# Patient Record
Sex: Female | Born: 1955 | Race: White | Hispanic: No | Smoking: Former smoker
Health system: Southern US, Community
[De-identification: ages and names within clinical notes are randomized; demographics above are authoritative.]

## PROBLEM LIST (undated history)

## (undated) VITALS — BP 112/78 | HR 77 | Temp 97.6°F | Resp 18

## (undated) DIAGNOSIS — F410 Panic disorder [episodic paroxysmal anxiety] without agoraphobia: Secondary | ICD-10-CM

## (undated) DIAGNOSIS — E7800 Pure hypercholesterolemia, unspecified: Secondary | ICD-10-CM

## (undated) DIAGNOSIS — I10 Essential (primary) hypertension: Secondary | ICD-10-CM

## (undated) DIAGNOSIS — R51 Headache: Secondary | ICD-10-CM

## (undated) DIAGNOSIS — F319 Bipolar disorder, unspecified: Secondary | ICD-10-CM

## (undated) DIAGNOSIS — F419 Anxiety disorder, unspecified: Secondary | ICD-10-CM

## (undated) HISTORY — PX: APPENDECTOMY: SHX54

## (undated) HISTORY — PX: SHOULDER SURGERY: SHX246

---

## 2000-09-27 ENCOUNTER — Encounter (HOSPITAL_COMMUNITY): Admission: RE | Admit: 2000-09-27 | Discharge: 2000-10-27 | Payer: Self-pay | Admitting: Preventative Medicine

## 2004-02-26 ENCOUNTER — Ambulatory Visit (HOSPITAL_COMMUNITY): Admission: RE | Admit: 2004-02-26 | Discharge: 2004-02-26 | Payer: Self-pay | Admitting: Family Medicine

## 2005-01-28 ENCOUNTER — Ambulatory Visit (HOSPITAL_COMMUNITY): Admission: RE | Admit: 2005-01-28 | Discharge: 2005-01-29 | Payer: Self-pay | Admitting: Internal Medicine

## 2005-12-24 ENCOUNTER — Emergency Department (HOSPITAL_COMMUNITY): Admission: EM | Admit: 2005-12-24 | Discharge: 2005-12-24 | Payer: Self-pay | Admitting: Emergency Medicine

## 2006-02-22 ENCOUNTER — Encounter (HOSPITAL_COMMUNITY): Admission: RE | Admit: 2006-02-22 | Discharge: 2006-03-24 | Payer: Self-pay | Admitting: Orthopaedic Surgery

## 2006-03-22 ENCOUNTER — Ambulatory Visit (HOSPITAL_COMMUNITY): Admission: RE | Admit: 2006-03-22 | Discharge: 2006-03-22 | Payer: Self-pay | Admitting: Family Medicine

## 2006-03-29 ENCOUNTER — Encounter (HOSPITAL_COMMUNITY): Admission: RE | Admit: 2006-03-29 | Discharge: 2006-04-28 | Payer: Self-pay | Admitting: Orthopaedic Surgery

## 2006-08-18 ENCOUNTER — Ambulatory Visit (HOSPITAL_COMMUNITY): Admission: RE | Admit: 2006-08-18 | Discharge: 2006-08-18 | Payer: Self-pay | Admitting: Family Medicine

## 2006-09-28 ENCOUNTER — Emergency Department (HOSPITAL_COMMUNITY): Admission: EM | Admit: 2006-09-28 | Discharge: 2006-09-28 | Payer: Self-pay | Admitting: Emergency Medicine

## 2006-09-30 ENCOUNTER — Emergency Department (HOSPITAL_COMMUNITY): Admission: EM | Admit: 2006-09-30 | Discharge: 2006-09-30 | Payer: Self-pay | Admitting: Emergency Medicine

## 2007-01-18 ENCOUNTER — Ambulatory Visit (HOSPITAL_COMMUNITY): Admission: RE | Admit: 2007-01-18 | Discharge: 2007-01-18 | Payer: Self-pay | Admitting: Family Medicine

## 2007-02-08 ENCOUNTER — Emergency Department (HOSPITAL_COMMUNITY): Admission: EM | Admit: 2007-02-08 | Discharge: 2007-02-08 | Payer: Self-pay | Admitting: Emergency Medicine

## 2007-04-17 ENCOUNTER — Emergency Department (HOSPITAL_COMMUNITY): Admission: EM | Admit: 2007-04-17 | Discharge: 2007-04-17 | Payer: Self-pay | Admitting: Emergency Medicine

## 2007-04-19 ENCOUNTER — Emergency Department (HOSPITAL_COMMUNITY): Admission: EM | Admit: 2007-04-19 | Discharge: 2007-04-19 | Payer: Self-pay | Admitting: Emergency Medicine

## 2007-04-25 ENCOUNTER — Emergency Department (HOSPITAL_COMMUNITY): Admission: EM | Admit: 2007-04-25 | Discharge: 2007-04-25 | Payer: Self-pay | Admitting: Emergency Medicine

## 2007-07-09 ENCOUNTER — Emergency Department (HOSPITAL_COMMUNITY): Admission: EM | Admit: 2007-07-09 | Discharge: 2007-07-09 | Payer: Self-pay | Admitting: Emergency Medicine

## 2007-07-15 ENCOUNTER — Emergency Department (HOSPITAL_COMMUNITY): Admission: EM | Admit: 2007-07-15 | Discharge: 2007-07-15 | Payer: Self-pay | Admitting: Emergency Medicine

## 2007-08-10 ENCOUNTER — Emergency Department (HOSPITAL_COMMUNITY): Admission: EM | Admit: 2007-08-10 | Discharge: 2007-08-10 | Payer: Self-pay | Admitting: Emergency Medicine

## 2007-08-12 ENCOUNTER — Emergency Department (HOSPITAL_COMMUNITY): Admission: EM | Admit: 2007-08-12 | Discharge: 2007-08-12 | Payer: Self-pay | Admitting: Emergency Medicine

## 2007-11-19 ENCOUNTER — Encounter: Payer: Self-pay | Admitting: Orthopedic Surgery

## 2007-11-19 ENCOUNTER — Emergency Department (HOSPITAL_COMMUNITY): Admission: EM | Admit: 2007-11-19 | Discharge: 2007-11-19 | Payer: Self-pay | Admitting: Emergency Medicine

## 2008-01-11 ENCOUNTER — Emergency Department (HOSPITAL_COMMUNITY): Admission: EM | Admit: 2008-01-11 | Discharge: 2008-01-11 | Payer: Self-pay | Admitting: Emergency Medicine

## 2008-01-16 ENCOUNTER — Ambulatory Visit (HOSPITAL_COMMUNITY): Admission: RE | Admit: 2008-01-16 | Discharge: 2008-01-16 | Payer: Self-pay | Admitting: Family Medicine

## 2008-02-12 ENCOUNTER — Ambulatory Visit: Payer: Self-pay | Admitting: Orthopedic Surgery

## 2008-02-12 DIAGNOSIS — S53106A Unspecified dislocation of unspecified ulnohumeral joint, initial encounter: Secondary | ICD-10-CM | POA: Insufficient documentation

## 2008-02-12 DIAGNOSIS — S42209A Unspecified fracture of upper end of unspecified humerus, initial encounter for closed fracture: Secondary | ICD-10-CM | POA: Insufficient documentation

## 2008-02-27 ENCOUNTER — Telehealth: Payer: Self-pay | Admitting: Orthopedic Surgery

## 2008-03-16 ENCOUNTER — Emergency Department (HOSPITAL_COMMUNITY): Admission: EM | Admit: 2008-03-16 | Discharge: 2008-03-16 | Payer: Self-pay | Admitting: Emergency Medicine

## 2008-03-19 ENCOUNTER — Telehealth: Payer: Self-pay | Admitting: Orthopedic Surgery

## 2008-05-02 ENCOUNTER — Encounter: Payer: Self-pay | Admitting: Orthopedic Surgery

## 2008-07-03 ENCOUNTER — Emergency Department (HOSPITAL_COMMUNITY): Admission: EM | Admit: 2008-07-03 | Discharge: 2008-07-03 | Payer: Self-pay | Admitting: Emergency Medicine

## 2008-08-07 ENCOUNTER — Encounter: Payer: Self-pay | Admitting: Orthopedic Surgery

## 2008-08-10 ENCOUNTER — Encounter: Payer: Self-pay | Admitting: Orthopedic Surgery

## 2008-11-01 ENCOUNTER — Emergency Department (HOSPITAL_COMMUNITY): Admission: EM | Admit: 2008-11-01 | Discharge: 2008-11-01 | Payer: Self-pay | Admitting: Emergency Medicine

## 2009-05-20 ENCOUNTER — Emergency Department (HOSPITAL_COMMUNITY): Admission: EM | Admit: 2009-05-20 | Discharge: 2009-05-20 | Payer: Self-pay | Admitting: Emergency Medicine

## 2009-09-12 ENCOUNTER — Emergency Department (HOSPITAL_COMMUNITY): Admission: EM | Admit: 2009-09-12 | Discharge: 2009-09-12 | Payer: Self-pay | Admitting: Emergency Medicine

## 2010-01-06 ENCOUNTER — Ambulatory Visit (HOSPITAL_COMMUNITY): Admission: RE | Admit: 2010-01-06 | Discharge: 2010-01-06 | Payer: Self-pay | Admitting: Family Medicine

## 2010-01-13 ENCOUNTER — Emergency Department (HOSPITAL_COMMUNITY): Admission: EM | Admit: 2010-01-13 | Discharge: 2010-01-13 | Payer: Self-pay | Admitting: Emergency Medicine

## 2010-02-18 ENCOUNTER — Ambulatory Visit (HOSPITAL_COMMUNITY)
Admission: RE | Admit: 2010-02-18 | Discharge: 2010-02-18 | Payer: Self-pay | Source: Home / Self Care | Admitting: Family Medicine

## 2010-05-23 ENCOUNTER — Emergency Department (HOSPITAL_COMMUNITY)
Admission: EM | Admit: 2010-05-23 | Discharge: 2010-05-23 | Payer: Self-pay | Source: Home / Self Care | Admitting: Emergency Medicine

## 2010-05-24 ENCOUNTER — Emergency Department (HOSPITAL_COMMUNITY)
Admission: EM | Admit: 2010-05-24 | Discharge: 2010-05-24 | Payer: Self-pay | Source: Home / Self Care | Admitting: Emergency Medicine

## 2010-06-14 ENCOUNTER — Encounter: Payer: Self-pay | Admitting: Family Medicine

## 2010-08-24 LAB — DIFFERENTIAL
Basophils Absolute: 0.1 10*3/uL (ref 0.0–0.1)
Basophils Relative: 1 % (ref 0–1)
Eosinophils Absolute: 0.1 10*3/uL (ref 0.0–0.7)
Eosinophils Relative: 1 % (ref 0–5)
Lymphocytes Relative: 27 % (ref 12–46)
Lymphs Abs: 2.1 10*3/uL (ref 0.7–4.0)
Monocytes Absolute: 0.4 10*3/uL (ref 0.1–1.0)
Monocytes Relative: 4 % (ref 3–12)
Neutro Abs: 5.3 10*3/uL (ref 1.7–7.7)
Neutrophils Relative %: 66 % (ref 43–77)

## 2010-08-24 LAB — CBC
HCT: 37.4 % (ref 36.0–46.0)
Hemoglobin: 12.7 g/dL (ref 12.0–15.0)
MCHC: 34 g/dL (ref 30.0–36.0)
MCV: 96.1 fL (ref 78.0–100.0)
Platelets: 185 10*3/uL (ref 150–400)
RBC: 3.89 MIL/uL (ref 3.87–5.11)
RDW: 14.5 % (ref 11.5–15.5)
WBC: 8 10*3/uL (ref 4.0–10.5)

## 2011-03-02 LAB — CULTURE, ROUTINE-ABSCESS

## 2011-03-12 ENCOUNTER — Encounter: Payer: Self-pay | Admitting: Emergency Medicine

## 2011-03-12 ENCOUNTER — Emergency Department (HOSPITAL_COMMUNITY)
Admission: EM | Admit: 2011-03-12 | Discharge: 2011-03-12 | Disposition: A | Discharge status: Home / Self Care | Payer: PRIVATE HEALTH INSURANCE | Attending: Emergency Medicine | Admitting: Emergency Medicine

## 2011-03-12 DIAGNOSIS — J45909 Unspecified asthma, uncomplicated: Secondary | ICD-10-CM | POA: Insufficient documentation

## 2011-03-12 DIAGNOSIS — M65839 Other synovitis and tenosynovitis, unspecified forearm: Secondary | ICD-10-CM | POA: Insufficient documentation

## 2011-03-12 DIAGNOSIS — M25519 Pain in unspecified shoulder: Secondary | ICD-10-CM | POA: Insufficient documentation

## 2011-03-12 DIAGNOSIS — M778 Other enthesopathies, not elsewhere classified: Secondary | ICD-10-CM

## 2011-03-12 DIAGNOSIS — M65849 Other synovitis and tenosynovitis, unspecified hand: Secondary | ICD-10-CM | POA: Insufficient documentation

## 2011-03-12 HISTORY — DX: Pure hypercholesterolemia, unspecified: E78.00

## 2011-03-12 MED ORDER — OXYCODONE-ACETAMINOPHEN 5-325 MG PO TABS: 1.0000 | ORAL_TABLET | ORAL | Status: AC | PRN

## 2011-03-12 NOTE — ED Provider Notes (Signed)
History     CSN: [id number] Arrival date & time: 03/12/2011  4:01 PM   First MD Initiated Contact with Patient 03/12/11 1605      Chief Complaint  Patient presents with  . Shoulder Pain    (Consider location/radiation/quality/duration/timing/severity/associated sxs/prior treatment) HPI Comments: Patient c/o worsening , chronic left shoulder pain.  States she had humerus fracture several months ago that required surgery to the shoulder joint and she has chronic pain since that time.  States that her PMD prescribes her a "pain patch" but her insurance company has recently denied payment for and she doesn't have anything currently to take for pain.  She denies new or changing shoulder symptoms.  She also c/o pain to right wrist for several days.  She denies numbness, weakness or injury.  Wrist pain is worse with movement.  States her doctor prescribed an NSAID for her pain, but yesterday she noticed redness and itching to her abdomen.  She denies swelling, difficulty swallowing or breathing. or hives  Patient is a 55 y.o. female presenting with shoulder pain and wrist pain. The history is provided by the patient.  Shoulder Pain This is a chronic problem. The current episode started in the past 7 days. The problem occurs constantly. The problem has been gradually worsening. Associated symptoms include arthralgias. Pertinent negatives include no abdominal pain, anorexia, change in bowel habit, chest pain, chills, congestion, coughing, fatigue, fever, joint swelling, myalgias, nausea, neck pain, numbness, rash, sore throat, urinary symptoms, vomiting or weakness. The symptoms are aggravated by twisting (movement). She has tried NSAIDs for the symptoms. The treatment provided no relief.  Wrist Pain This is a new problem. The current episode started in the past 7 days. The problem occurs constantly. The problem has been unchanged. Associated symptoms include arthralgias. Pertinent negatives include no  abdominal pain, anorexia, change in bowel habit, chest pain, chills, congestion, coughing, fatigue, fever, joint swelling, myalgias, nausea, neck pain, numbness, rash, sore throat, urinary symptoms, vomiting or weakness. Exacerbated by: movement and palpation. She has tried NSAIDs for the symptoms. The treatment provided no relief.    Past Medical History  Diagnosis Date  . High cholesterol   . Asthma     Past Surgical History  Procedure Date  . Shoulder surgery     History reviewed. No pertinent family history.  History  Substance Use Topics  . Smoking status: Never Smoker   . Smokeless tobacco: Not on file  . Alcohol Use: No    OB History    Grav Para Term Preterm Abortions TAB SAB Ect Mult Living                  Review of Systems  Constitutional: Negative for fever, chills and fatigue.  HENT: Negative for congestion, sore throat, trouble swallowing, neck pain and neck stiffness.   Respiratory: Negative for cough, shortness of breath and wheezing.   Cardiovascular: Negative for chest pain and palpitations.  Gastrointestinal: Negative for nausea, vomiting, abdominal pain, blood in stool, anorexia and change in bowel habit.  Genitourinary: Negative for dysuria, hematuria and flank pain.  Musculoskeletal: Positive for arthralgias. Negative for myalgias, back pain and joint swelling.  Skin: Negative for rash.  Neurological: Negative for dizziness, weakness and numbness.  Hematological: Does not bruise/bleed easily.    Allergies  Sulfonamide derivatives  Home Medications  No current outpatient prescriptions on file.  BP 114/78  Pulse 60  Temp(Src) 98 F (36.7 C) (Oral)  Resp 20  Ht 5\' 4"  (1.626  m)  Wt 113 lb (51.256 kg)  BMI 19.40 kg/m2  SpO2 97%  Physical Exam  Nursing note and vitals reviewed. Constitutional: She is oriented to person, place, and time. She appears well-developed and well-nourished. No distress.  HENT:  Head: Normocephalic and atraumatic.    Mouth/Throat: Oropharynx is clear and moist.  Neck: Normal range of motion. Neck supple.  Cardiovascular: Normal rate, regular rhythm and normal heart sounds.   Pulmonary/Chest: Effort normal and breath sounds normal. No respiratory distress. She exhibits no tenderness.  Abdominal: Soft. She exhibits no distension. There is no tenderness.  Musculoskeletal: She exhibits tenderness.       Left shoulder: She exhibits decreased range of motion, tenderness, bony tenderness and pain. She exhibits no swelling, no effusion, no crepitus, no laceration, normal pulse and normal strength.       Right wrist: She exhibits tenderness and swelling. She exhibits normal range of motion, no bony tenderness, no effusion, no crepitus, no deformity and no laceration.       Well healed surgical scar to the anterior left shoulder joint.  Radial pulse is strong and equal bilaterally.    Lymphadenopathy:    She has no cervical adenopathy.  Neurological: She is alert and oriented to person, place, and time. She has normal reflexes. No cranial nerve deficit. She exhibits normal muscle tone. Coordination normal.  Skin: Skin is warm and dry.    ED Course  ORTHOPEDIC INJURY TREATMENT Date/Time: 03/12/2011 4:40 PM Performed by: Trisha Mangle, Abbigaile Rockman L. Authorized by: Maxwell Caul Consent: Verbal consent obtained. Written consent not obtained. Risks and benefits: risks, benefits and alternatives were discussed Consent given by: patient Patient understanding: patient states understanding of the procedure being performed Patient consent: the patient's understanding of the procedure matches consent given Procedure consent: procedure consent matches procedure scheduled Patient identity confirmed: verbally with patient Time out: Immediately prior to procedure a "time out" was called to verify the correct patient, procedure, equipment, support staff and site/side marked as required. Injury location: wrist Pre-procedure  neurovascular assessment: neurovascularly intact Pre-procedure distal perfusion: normal Pre-procedure neurological function: normal Pre-procedure range of motion: normal Local anesthesia used: no Patient sedated: no Immobilization: brace Splint type: velcro wrist brace. Post-procedure neurovascular assessment: post-procedure neurovascularly intact Post-procedure distal perfusion: normal Post-procedure neurological function: normal Post-procedure range of motion: normal Patient tolerance: Patient tolerated the procedure well with no immediate complications.   (including critical care time)       MDM   I have reviewed the patient's previous medical record.  I have recommended that she d/c the NSAID.  No hives or edema or airway compromise.  I will prescribe a short course of pain medication and pt agrees to f/u with Dr. Janna Arch.    Patient / Family / Caregiver understand and agree with initial ED impression and plan with expectations set for ED visit.        Myya Meenach L. Kimisha Eunice, PA 03/12/11 1642  Tyjai Matuszak L. [place], [place] 03/12/11 1651

## 2011-03-12 NOTE — ED Provider Notes (Signed)
Medical screening examination/treatment/procedure(s) were performed by non-physician practitioner and as supervising physician I was immediately available for consultation/collaboration.  Donnetta Hutching, MD 03/12/11 [phone]

## 2011-03-12 NOTE — ED Notes (Signed)
Pt here for left shoulder pain-h/s surgery 08/07/08. Pt also c/o pain to right wrist.

## 2011-03-14 ENCOUNTER — Emergency Department (HOSPITAL_COMMUNITY)
Admission: EM | Admit: 2011-03-14 | Discharge: 2011-03-14 | Disposition: A | Discharge status: Home / Self Care | Payer: PRIVATE HEALTH INSURANCE | Attending: Emergency Medicine | Admitting: Emergency Medicine

## 2011-03-14 ENCOUNTER — Encounter (HOSPITAL_COMMUNITY): Payer: Self-pay

## 2011-03-14 ENCOUNTER — Emergency Department (HOSPITAL_COMMUNITY): Payer: PRIVATE HEALTH INSURANCE

## 2011-03-14 DIAGNOSIS — K5289 Other specified noninfective gastroenteritis and colitis: Secondary | ICD-10-CM | POA: Insufficient documentation

## 2011-03-14 DIAGNOSIS — E7800 Pure hypercholesterolemia, unspecified: Secondary | ICD-10-CM | POA: Insufficient documentation

## 2011-03-14 DIAGNOSIS — J45909 Unspecified asthma, uncomplicated: Secondary | ICD-10-CM | POA: Insufficient documentation

## 2011-03-14 DIAGNOSIS — R0602 Shortness of breath: Secondary | ICD-10-CM | POA: Insufficient documentation

## 2011-03-14 DIAGNOSIS — K529 Noninfective gastroenteritis and colitis, unspecified: Secondary | ICD-10-CM

## 2011-03-14 DIAGNOSIS — I10 Essential (primary) hypertension: Secondary | ICD-10-CM | POA: Insufficient documentation

## 2011-03-14 DIAGNOSIS — F319 Bipolar disorder, unspecified: Secondary | ICD-10-CM | POA: Insufficient documentation

## 2011-03-14 HISTORY — DX: Bipolar disorder, unspecified: F31.9

## 2011-03-14 HISTORY — DX: Essential (primary) hypertension: I10

## 2011-03-14 LAB — DIFFERENTIAL
Basophils Absolute: 0 10*3/uL (ref 0.0–0.1)
Basophils Relative: 0 % (ref 0–1)
Lymphocytes Relative: 10 % — ABNORMAL LOW (ref 12–46)
Monocytes Absolute: 0.2 10*3/uL (ref 0.1–1.0)
Monocytes Relative: 3 % (ref 3–12)
Neutro Abs: 6.3 10*3/uL (ref 1.7–7.7)
Neutrophils Relative %: 86 % — ABNORMAL HIGH (ref 43–77)

## 2011-03-14 LAB — COMPREHENSIVE METABOLIC PANEL
AST: 17 U/L (ref 0–37)
Albumin: 3.5 g/dL (ref 3.5–5.2)
Alkaline Phosphatase: 40 U/L (ref 39–117)
BUN: 13 mg/dL (ref 6–23)
CO2: 25 mEq/L (ref 19–32)
Chloride: 105 mEq/L (ref 96–112)
Creatinine, Ser: 0.65 mg/dL (ref 0.50–1.10)
GFR calc non Af Amer: >90 mL/min (ref >90)
Potassium: 4.4 mEq/L (ref 3.5–5.1)
Total Bilirubin: 0.2 mg/dL — ABNORMAL LOW (ref 0.3–1.2)

## 2011-03-14 LAB — CBC
HCT: 36.9 % (ref 36.0–46.0)
Hemoglobin: 12.5 g/dL (ref 12.0–15.0)
RBC: 3.9 MIL/uL (ref 3.87–5.11)
WBC: 7.3 10*3/uL (ref 4.0–10.5)

## 2011-03-14 MED ORDER — HYDROCODONE-ACETAMINOPHEN 5-325 MG PO TABS: 1.0000 | ORAL_TABLET | ORAL | Status: AC | PRN

## 2011-03-14 MED ORDER — SODIUM CHLORIDE 0.9 % IV BOLUS (SEPSIS)
1000.0000 mL | Freq: Once | INTRAVENOUS | Status: AC
  Administered 2011-03-14 (×2): 1000 mL via INTRAVENOUS

## 2011-03-14 MED ORDER — PROMETHAZINE HCL 25 MG/ML IJ SOLN
INTRAMUSCULAR | Status: AC
  Administered 2011-03-14: 12.5 mg via INTRAVENOUS
  Filled 2011-03-14: qty 1

## 2011-03-14 MED ORDER — SODIUM CHLORIDE 0.9 % IV BOLUS (SEPSIS): 1000.0000 mL | Freq: Once | INTRAVENOUS | Status: DC

## 2011-03-14 MED ORDER — DIPHENOXYLATE-ATROPINE 2.5-0.025 MG PO TABS: 1.0000 | ORAL_TABLET | Freq: Four times a day (QID) | ORAL | Status: AC | PRN

## 2011-03-14 MED ORDER — HYDROMORPHONE HCL 1 MG/ML IJ SOLN
1.0000 mg | Freq: Once | INTRAMUSCULAR | Status: AC
  Administered 2011-03-14: 1 mg via INTRAVENOUS
  Filled 2011-03-14: qty 1

## 2011-03-14 MED ORDER — ONDANSETRON HCL 4 MG PO TABS: 4.0000 mg | ORAL_TABLET | Freq: Four times a day (QID) | ORAL | Status: AC

## 2011-03-14 MED ORDER — PANTOPRAZOLE SODIUM 40 MG IV SOLR
40.0000 mg | Freq: Once | INTRAVENOUS | Status: AC
  Administered 2011-03-14: 40 mg via INTRAVENOUS
  Filled 2011-03-14: qty 40

## 2011-03-14 MED ORDER — ONDANSETRON HCL 4 MG/2ML IJ SOLN
INTRAMUSCULAR | Status: AC
  Administered 2011-03-14: 4 mg via INTRAVENOUS
  Filled 2011-03-14: qty 2

## 2011-03-14 NOTE — ED Provider Notes (Signed)
Scribed for Donnetta Hutching, MD, the patient was seen in room APA02/APA02 . This chart was scribed by Ellie Lunch.   CSN: [id number] Arrival date & time: 03/14/2011  3:25 PM   First MD Initiated Contact with Patient 03/14/11 1532      Chief Complaint  Patient presents with  . Nausea  . Emesis  . Diarrhea    (Consider location/radiation/quality/duration/timing/severity/associated sxs/prior treatment) HPI Melinda Wood is a 55 y.o. female with a history of asthma brought in by ambulance to the Emergency Department complaining of emesis since yesterday morning at 10 with associated nausea, diarrhea (1 episode) and diffuse abdominal pain. Pt describes symptoms as constant and severe. Pt reports sx are improved by nothing. Pt also reports SOB. Pt reports she has an inhaler but was not able to find it. SOB aggravated by supine position and exertion. Denies wheezing. There are no other associated symptoms and no other alleviating or aggravating factors.   Past Medical History  Diagnosis Date  . High cholesterol   . Asthma   . Hypertension   . Bipolar 1 disorder   . Urinary incontinence     Past Surgical History  Procedure Date  . Shoulder surgery   . Appendectomy     No family history on file.  History  Substance Use Topics  . Smoking status: Never Smoker   . Smokeless tobacco: Not on file  . Alcohol Use: No   Review of Systems 10 Systems reviewed and are negative for acute change except as noted in the HPI.  Allergies  Sulfonamide derivatives  Home Medications   Current Outpatient Rx  Name Route Sig Dispense Refill  . ALPRAZOLAM 1 MG PO TABS Oral Take 0.5 mg by mouth 2 (two) times daily as needed. anxiety     . ATOMOXETINE HCL 60 MG PO CAPS Oral Take 60 mg by mouth daily.      Marland Kitchen DIVALPROEX SODIUM 250 MG PO TB24 Oral Take 250 mg by mouth at bedtime.      . FESOTERODINE FUMARATE 4 MG PO TB24 Oral Take 4 mg by mouth daily.      Marland Kitchen HYDROXYZINE PAMOATE 50 MG PO CAPS Oral  Take 50 mg by mouth at bedtime.      . OXYCODONE-ACETAMINOPHEN 5-325 MG PO TABS Oral Take 1 tablet by mouth every 4 (four) hours as needed for pain. 20 tablet 0  . PREGABALIN 50 MG PO CAPS Oral Take 50 mg by mouth at bedtime.      Marland Kitchen PRIMIDONE 250 MG PO TABS Oral Take 125-250 mg by mouth 3 (three) times daily. Patient takes 125mg  in the morning and 125mg  midday and 250mg  at bedtime     . PRIMIDONE 50 MG PO TABS Oral Take 100 mg by mouth 3 (three) times daily.      Marland Kitchen RISEDRONATE SODIUM 35 MG PO TBEC Oral Take 1 tablet by mouth once a week.      Marland Kitchen ROSUVASTATIN CALCIUM 10 MG PO TABS Oral Take 10 mg by mouth daily.        BP 144/70  Pulse 61  Temp(Src) 98.7 F (37.1 C) (Oral)  Resp 21  Ht 5\' 4"  (1.626 m)  Wt 113 lb (51.256 kg)  BMI 19.40 kg/m2  SpO2 100%  Physical Exam  Nursing note and vitals reviewed. Constitutional: She is oriented to person, place, and time. She appears well-developed and well-nourished.  HENT:  Head: Normocephalic and atraumatic.  Eyes: Conjunctivae and EOM are normal.  Neck: Normal  range of motion. Neck supple.  Cardiovascular: Normal rate, regular rhythm and normal heart sounds.   Pulmonary/Chest: Effort normal and breath sounds normal.  Abdominal: Soft. She exhibits no distension. There is tenderness (minimal diffuse tenderness).  Musculoskeletal: Normal range of motion.  Neurological: She is alert and oriented to person, place, and time.  Skin: Skin is warm and dry.  Psychiatric: She has a normal mood and affect. Her behavior is normal.   ED Course  Procedures (including critical care time)  OTHER DATA REVIEWED: Nursing notes, vital signs, and past medical records reviewed.  DIAGNOSTIC STUDIES: Oxygen Saturation is 100% on room air, normal by my interpretation.   ED LABS Labs Reviewed  DIFFERENTIAL - Abnormal; Notable for the following:    Neutrophils Relative 86 (*)    Lymphocytes Relative 10 (*)    All other components within normal limits    COMPREHENSIVE METABOLIC PANEL - Abnormal; Notable for the following:    Glucose, Bld 115 (*)    Calcium 8.2 (*)    Total Protein 5.8 (*)    Total Bilirubin 0.2 (*)    All other components within normal limits  CBC  LIPASE, BLOOD   Dg Chest 2 View  03/14/2011  *RADIOLOGY REPORT*  Clinical Data: Shortness of breath.  History of asthma.  CHEST - 2 VIEW  Comparison: 01/11/2008  Findings: Lateral view degraded by patient arm position.  Proximal left humeral fixation. Midline trachea.  Normal heart size and mediastinal contours. No pleural effusion or pneumothorax. Clear lungs.  IMPRESSION: No acute cardiopulmonary disease.  Original Report Authenticated By: Consuello Bossier, M.D.    3:57 PM discussed with PT appears dehydrated. Plan to check blood, xray chest. Plan to treat with fluids, zofran and treat pain.  ED MEDS  Medications  sodium chloride 0.9 % bolus 1,000 mL   pantoprazole (PROTONIX) injection 40 mg   ondansetron (ZOFRAN) 4 MG/2ML injection (4 mg Intravenous Given 03/14/11 1535)  sodium chloride 0.9 % bolus 1,000 mL (1000 mL Intravenous Given 03/14/11 1542)  HYDROmorphone (DILAUDID) injection 1 mg (1 mg Intravenous Given 03/14/11 1541)   No diagnosis found.   MDM  He should feeling better after IV hydration. Labs acceptable. Can discharge home.  I personally performed the services described in this documentation, which was scribed in my presence. The recorded information has been reviewed and considered.         Donnetta Hutching, MD 03/16/11 0005

## 2011-03-14 NOTE — ED Notes (Signed)
Pt c/o n/v/d since 10am yesterday morning.

## 2011-03-14 NOTE — ED Notes (Signed)
Has had an episode of vomiting and is reporting nausea--has had approx. 1500 mls of normal saline IV fluid and reports she does not feel she needs to urinate.  IV fluid turned down to slower rate.  Lungs remain CTA

## 2011-03-14 NOTE — ED Notes (Signed)
Pt left er stating no needs 

## 2011-03-14 NOTE — ED Notes (Signed)
DUPLICATE ENTRY--Receiving initial 1 liter bolus now

## 2011-03-14 NOTE — ED Notes (Signed)
To x-ray via stretcher--skin color more pink and does not appear as weak as at arrival time.

## 2011-03-14 NOTE — ED Notes (Signed)
Reports IV med has eased abd. Pain some but still c/o nausea.

## 2011-03-14 NOTE — ED Notes (Signed)
C/o SEVERE low mid abdominal pain, nausea with vomiting and diarrhea numerous times over the past two days---hyperactive bowel sounds all quads

## 2011-03-14 NOTE — ED Notes (Signed)
Again, requested she try to void but, states "I dont have to right now."

## 2011-08-05 ENCOUNTER — Encounter (HOSPITAL_COMMUNITY): Payer: Self-pay | Admitting: *Deleted

## 2011-08-05 ENCOUNTER — Emergency Department (HOSPITAL_COMMUNITY)
Admission: EM | Admit: 2011-08-05 | Discharge: 2011-08-05 | Disposition: A | Discharge status: Home / Self Care | Payer: Medicare PPO | Attending: Emergency Medicine | Admitting: Emergency Medicine

## 2011-08-05 ENCOUNTER — Emergency Department (HOSPITAL_COMMUNITY): Payer: Medicare PPO

## 2011-08-05 DIAGNOSIS — I10 Essential (primary) hypertension: Secondary | ICD-10-CM | POA: Insufficient documentation

## 2011-08-05 DIAGNOSIS — Z79899 Other long term (current) drug therapy: Secondary | ICD-10-CM | POA: Insufficient documentation

## 2011-08-05 DIAGNOSIS — R209 Unspecified disturbances of skin sensation: Secondary | ICD-10-CM | POA: Insufficient documentation

## 2011-08-05 DIAGNOSIS — Y92009 Unspecified place in unspecified non-institutional (private) residence as the place of occurrence of the external cause: Secondary | ICD-10-CM | POA: Insufficient documentation

## 2011-08-05 DIAGNOSIS — W1809XA Striking against other object with subsequent fall, initial encounter: Secondary | ICD-10-CM | POA: Insufficient documentation

## 2011-08-05 DIAGNOSIS — E7800 Pure hypercholesterolemia, unspecified: Secondary | ICD-10-CM | POA: Insufficient documentation

## 2011-08-05 DIAGNOSIS — R079 Chest pain, unspecified: Secondary | ICD-10-CM | POA: Insufficient documentation

## 2011-08-05 DIAGNOSIS — J45909 Unspecified asthma, uncomplicated: Secondary | ICD-10-CM | POA: Insufficient documentation

## 2011-08-05 DIAGNOSIS — S20219A Contusion of unspecified front wall of thorax, initial encounter: Secondary | ICD-10-CM | POA: Insufficient documentation

## 2011-08-05 DIAGNOSIS — F319 Bipolar disorder, unspecified: Secondary | ICD-10-CM | POA: Insufficient documentation

## 2011-08-05 MED ORDER — HYDROCODONE-ACETAMINOPHEN 5-325 MG PO TABS
1.0000 | ORAL_TABLET | Freq: Once | ORAL | Status: AC
  Administered 2011-08-05: 1 via ORAL
  Filled 2011-08-05: qty 1

## 2011-08-05 MED ORDER — HYDROCODONE-ACETAMINOPHEN 5-325 MG PO TABS: 1.0000 | ORAL_TABLET | ORAL | Status: AC | PRN

## 2011-08-05 MED ORDER — KETOROLAC TROMETHAMINE 60 MG/2ML IM SOLN
60.0000 mg | Freq: Once | INTRAMUSCULAR | Status: AC
  Administered 2011-08-05: 60 mg via INTRAMUSCULAR
  Filled 2011-08-05: qty 2

## 2011-08-05 NOTE — ED Notes (Addendum)
Pt c/o increased left rib pain with each breath secondary to a fall she sustained yesterday on a "scatter rug" while getting out of bed. Pt states pain "is localized in the ribs".  Pt denies other injuries at this time.  BBS clear and equal.

## 2011-08-05 NOTE — Discharge Instructions (Signed)
Chest Contusion You have been checked for injuries to your chest. Your caregiver has not found injuries serious enough to require hospitalization. It is common to have bruises and sore muscles after an injury. These tend to feel worse the first 24 hours. You may gradually develop more stiffness and soreness over the next several hours to several days. This usually feels worse the first morning following your injury. After a few days, you will usually begin to improve. The amount of improvement depends on the amount of damage. Following the accident, if the pain in any area continues to increase or you develop new areas of pain, you should see your primary caregiver or return to the Emergency Department for re-evaluation. HOME CARE INSTRUCTIONS   Put ice on sore areas every 2 hours for 20 minutes while awake for the next 2 days.   Drink extra fluids. Do not drink alcohol.   Activity as tolerated. Lifting may make pain worse.   Only take over-the-counter or prescription medicines for pain, discomfort, or fever as directed by your caregiver. Do not use aspirin. This may increase bruising or increase bleeding.  SEEK IMMEDIATE MEDICAL CARE IF:   There is a worsening of any of the problems that brought you in for care.   Shortness of breath, dizziness or fainting develop.   You have chest pain, difficulty breathing, or develop pain going down the left arm or up into jaw.   You feel sick to your stomach (nausea), vomiting or sweats.   You have increasing belly (abdominal) discomfort.   There is blood in your urine, stool, or if you vomit blood.   There is pain in either shoulder in an area where a shoulder strap would be.   You have feelings of lightheadedness, or if you should have a fainting episode.   You have numbness, tingling, weakness, or problems with the use of your arms or legs.   Severe headaches not relieved with medications develop.   You have a change in bowel or bladder  control.   There is increasing pain in any areas of the body.  If you feel your symptoms are worsening, and you are not able to see your primary caregiver, return to the Emergency Department immediately. MAKE SURE YOU:   Understand these instructions.   Will watch your condition.   Will get help right away if you are not doing well or get worse.  Document Released: 02/02/2001 Document Revised: 04/29/2011 Document Reviewed: 12/27/2007 [facility] Patient Information 2012 [place], [place].    You may take the hydrocodone prescribed for pain relief.  This will make you drowsy - do not drive within 4 hours of taking this medication.  Use a heating pad applied to your chest for 20 minutes several times daily.  Your xrays are negative for rib fracture.

## 2011-08-05 NOTE — ED Notes (Signed)
Slipped on rug when getting OOB. Struck lt ribs against chest.

## 2011-08-06 NOTE — ED Provider Notes (Signed)
History     CSN: [id number]  Arrival date & time 08/05/11  1423   First MD Initiated Contact with Patient 08/05/11 1512      Chief Complaint  Patient presents with  . Fall    (Consider location/radiation/quality/duration/timing/severity/associated sxs/prior treatment) Patient is a 56 y.o. female presenting with fall. The history is provided by the patient.  Fall The accident occurred 3 to 5 hours ago. The fall occurred while standing (She slipped on a rug as she was getting out of bed this morning landing with her left chest against her nightstand.). There was no blood loss. Point of impact: Chest. Pain location: Chest. The pain is at a severity of 10/10. The pain is severe. She was ambulatory at the scene. There was no entrapment after the fall. Pertinent negatives include no fever, no numbness, no abdominal pain, no nausea, no headaches and no loss of consciousness. Associated symptoms comments: Patient did not hit her head.  She denies any other injury.. Exacerbated by: Palpation and deep inspiration makes symptoms worse. She has tried nothing for the symptoms.    Past Medical History  Diagnosis Date  . High cholesterol   . Asthma   . Hypertension   . Bipolar 1 disorder   . Urinary incontinence     Past Surgical History  Procedure Date  . Shoulder surgery   . Appendectomy     History reviewed. No pertinent family history.  History  Substance Use Topics  . Smoking status: Never Smoker   . Smokeless tobacco: Not on file  . Alcohol Use: No    OB History    Grav Para Term Preterm Abortions TAB SAB Ect Mult Living                  Review of Systems  Constitutional: Negative for fever.  HENT: Negative for congestion, sore throat and neck pain.   Eyes: Negative.   Respiratory: Negative for cough, chest tightness and shortness of breath.   Cardiovascular: Positive for chest pain.  Gastrointestinal: Negative for nausea and abdominal pain.  Genitourinary: Negative.     Musculoskeletal: Negative for joint swelling and arthralgias.  Skin: Negative.  Negative for rash and wound.  Neurological: Negative for dizziness, loss of consciousness, weakness, light-headedness, numbness and headaches.  Hematological: Negative.   Psychiatric/Behavioral: Negative.     Allergies  Arthrotec and Sulfonamide derivatives  Home Medications   Current Outpatient Rx  Name Route Sig Dispense Refill  . ALPRAZOLAM 0.5 MG PO TABS Oral Take 0.5 mg by mouth daily as needed. For anxiety    . ATOMOXETINE HCL 60 MG PO CAPS Oral Take 60 mg by mouth daily.      Marland Kitchen CALCIUM CARBONATE-VITAMIN D 500-200 MG-UNIT PO TABS Oral Take 1 tablet by mouth 2 (two) times daily.      Marland Kitchen DIVALPROEX SODIUM ER 250 MG PO TB24 Oral Take 250 mg by mouth at bedtime.      Marland Kitchen DIVALPROEX SODIUM ER 500 MG PO TB24 Oral Take 500 mg by mouth at bedtime.      Marland Kitchen FLUTICASONE-SALMETEROL 250-50 MCG/DOSE IN AEPB Inhalation Inhale 1 puff into the lungs every 12 (twelve) hours.      Marland Kitchen OLMESARTAN MEDOXOMIL 40 MG PO TABS Oral Take 40 mg by mouth daily.      Marland Kitchen PAROXETINE HCL 20 MG PO TABS Oral Take 20 mg by mouth every morning.      Marland Kitchen PREGABALIN 50 MG PO CAPS Oral Take 50 mg by mouth  at bedtime.      Marland Kitchen RISEDRONATE SODIUM 35 MG PO TBEC Oral Take 1 tablet by mouth once a week. Patient takes on Tuesday!!    . ROSUVASTATIN CALCIUM 10 MG PO TABS Oral Take 10 mg by mouth daily.      . TETRAHYDROZOLINE HCL 0.05 % OP SOLN Both Eyes Place 1 drop into both eyes daily.    Marland Kitchen DETROL PO Oral Take 1 tablet by mouth every morning.    . TRAZODONE HCL 100 MG PO TABS Oral Take 200 mg by mouth at bedtime.     Marland Kitchen VITAMIN B-12 1000 MCG PO TABS Oral Take 1,000 mcg by mouth daily.      Marland Kitchen VITAMIN C 500 MG PO TABS Oral Take 500 mg by mouth at bedtime.     Marland Kitchen HYDROCODONE-ACETAMINOPHEN 5-325 MG PO TABS Oral Take 1 tablet by mouth every 4 (four) hours as needed for pain. 15 tablet 0    BP 119/84  Pulse 84  Temp(Src) 97.9 F (36.6 C) (Oral)  Resp 17   Ht 5\' 4"  (1.626 m)  Wt 120 lb (54.432 kg)  BMI 20.60 kg/m2  SpO2 95%  Physical Exam  Nursing note and vitals reviewed. Constitutional: She is oriented to person, place, and time. She appears well-developed and well-nourished.  HENT:  Head: Normocephalic and atraumatic.  Eyes: Conjunctivae are normal.  Neck: Normal range of motion.  Cardiovascular: Normal rate, regular rhythm, normal heart sounds and intact distal pulses.   Pulmonary/Chest: Effort normal and breath sounds normal. No respiratory distress. She has no wheezes. She has no rales. She exhibits tenderness.    Abdominal: Soft. Bowel sounds are normal. There is no tenderness. There is no rigidity and no guarding.  Musculoskeletal: Normal range of motion.  Neurological: She is alert and oriented to person, place, and time.  Skin: Skin is warm and dry.  Psychiatric: She has a normal mood and affect.    ED Course  Procedures (including critical care time)  Labs Reviewed - No data to display Dg Ribs Unilateral W/chest Left  08/05/2011  *RADIOLOGY REPORT*  Clinical Data: Pain after fall  LEFT RIBS AND CHEST - 3+ VIEW  Comparison: March 14, 2011  Findings: The cardiac silhouette, mediastinum, pulmonary vasculature are within normal limits.  Both lungs are clear.  There is no evidence of rib fracture or pneumothorax.  Side plate and screw fixation of old proximal left humeral fracture is stable in appearance.  IMPRESSION: There is no evidence of acute abnormality.  Original Report Authenticated By: Brandon Melnick, M.D.     1. Chest wall contusion       MDM  Hydrocodone, patient cautioned regarding sedating effect.  Heating pad.  Follow up with PCP if not improving over the next week to 10 days.  Also encouraged to get rechecked if she develop shortness of breath fevers chills or increased cough.       Candis Musa, PA 08/06/11 [phone]

## 2011-08-06 NOTE — ED Provider Notes (Signed)
Medical screening examination/treatment/procedure(s) were performed by non-physician practitioner and as supervising physician I was immediately available for consultation/collaboration.   Jelissa Espiritu, MD 08/06/11 1308 

## 2012-01-01 ENCOUNTER — Encounter (HOSPITAL_COMMUNITY): Payer: Self-pay | Admitting: Emergency Medicine

## 2012-01-01 ENCOUNTER — Emergency Department (HOSPITAL_COMMUNITY)
Admission: EM | Admit: 2012-01-01 | Discharge: 2012-01-01 | Disposition: A | Discharge status: Home / Self Care | Payer: PRIVATE HEALTH INSURANCE | Attending: Emergency Medicine | Admitting: Emergency Medicine

## 2012-01-01 ENCOUNTER — Emergency Department (HOSPITAL_COMMUNITY): Payer: PRIVATE HEALTH INSURANCE

## 2012-01-01 DIAGNOSIS — R0602 Shortness of breath: Secondary | ICD-10-CM | POA: Insufficient documentation

## 2012-01-01 DIAGNOSIS — R0989 Other specified symptoms and signs involving the circulatory and respiratory systems: Secondary | ICD-10-CM | POA: Insufficient documentation

## 2012-01-01 DIAGNOSIS — R0609 Other forms of dyspnea: Secondary | ICD-10-CM | POA: Insufficient documentation

## 2012-01-01 DIAGNOSIS — Z79899 Other long term (current) drug therapy: Secondary | ICD-10-CM | POA: Insufficient documentation

## 2012-01-01 DIAGNOSIS — I959 Hypotension, unspecified: Secondary | ICD-10-CM | POA: Insufficient documentation

## 2012-01-01 DIAGNOSIS — E7800 Pure hypercholesterolemia, unspecified: Secondary | ICD-10-CM | POA: Insufficient documentation

## 2012-01-01 DIAGNOSIS — J45909 Unspecified asthma, uncomplicated: Secondary | ICD-10-CM | POA: Insufficient documentation

## 2012-01-01 DIAGNOSIS — R0600 Dyspnea, unspecified: Secondary | ICD-10-CM

## 2012-01-01 DIAGNOSIS — F319 Bipolar disorder, unspecified: Secondary | ICD-10-CM | POA: Insufficient documentation

## 2012-01-01 DIAGNOSIS — F17200 Nicotine dependence, unspecified, uncomplicated: Secondary | ICD-10-CM | POA: Insufficient documentation

## 2012-01-01 DIAGNOSIS — R0789 Other chest pain: Secondary | ICD-10-CM | POA: Insufficient documentation

## 2012-01-01 DIAGNOSIS — I10 Essential (primary) hypertension: Secondary | ICD-10-CM | POA: Insufficient documentation

## 2012-01-01 HISTORY — DX: Anxiety disorder, unspecified: F41.9

## 2012-01-01 HISTORY — DX: Panic disorder (episodic paroxysmal anxiety): F41.0

## 2012-01-01 LAB — TROPONIN I
Troponin I: <0.3 ng/mL (ref <0.30)
Troponin I: <0.3 ng/mL (ref <0.30)

## 2012-01-01 LAB — CBC WITH DIFFERENTIAL/PLATELET
Basophils Absolute: 0.1 10*3/uL (ref 0.0–0.1)
Eosinophils Relative: 1 % (ref 0–5)
HCT: 34.3 % — ABNORMAL LOW (ref 36.0–46.0)
Hemoglobin: 11.8 g/dL — ABNORMAL LOW (ref 12.0–15.0)
Lymphocytes Relative: 31 % (ref 12–46)
Lymphs Abs: 2.6 10*3/uL (ref 0.7–4.0)
MCV: 93.7 fL (ref 78.0–100.0)
Monocytes Absolute: 0.4 10*3/uL (ref 0.1–1.0)
Neutro Abs: 5.2 10*3/uL (ref 1.7–7.7)
RBC: 3.66 MIL/uL — ABNORMAL LOW (ref 3.87–5.11)
WBC: 8.3 10*3/uL (ref 4.0–10.5)

## 2012-01-01 LAB — BASIC METABOLIC PANEL
CO2: 20 mEq/L (ref 19–32)
Calcium: 10 mg/dL (ref 8.4–10.5)
Chloride: 101 mEq/L (ref 96–112)
Creatinine, Ser: 1.06 mg/dL (ref 0.50–1.10)
Glucose, Bld: 113 mg/dL — ABNORMAL HIGH (ref 70–99)

## 2012-01-01 LAB — PROCALCITONIN: Procalcitonin: <0.05 ng/mL

## 2012-01-01 LAB — LACTIC ACID, PLASMA: Lactic Acid, Venous: 1.7 mmol/L (ref 0.5–2.2)

## 2012-01-01 LAB — D-DIMER, QUANTITATIVE: D-Dimer, Quant: <0.22 ug/mL-FEU (ref 0.00–0.48)

## 2012-01-01 MED ORDER — SODIUM CHLORIDE 0.9 % IV SOLN
Freq: Once | INTRAVENOUS | Status: AC
  Administered 2012-01-01: 1000 mL via INTRAVENOUS

## 2012-01-01 MED ORDER — SODIUM CHLORIDE 0.9 % IV BOLUS (SEPSIS)
1000.0000 mL | Freq: Once | INTRAVENOUS | Status: AC
  Administered 2012-01-01: 1000 mL via INTRAVENOUS

## 2012-01-01 MED ORDER — LORAZEPAM 2 MG/ML IJ SOLN: 1.0000 mg | Freq: Once | INTRAMUSCULAR | Status: DC

## 2012-01-01 MED ORDER — ASPIRIN 81 MG PO CHEW
324.0000 mg | CHEWABLE_TABLET | Freq: Once | ORAL | Status: AC
  Administered 2012-01-01: 324 mg via ORAL
  Filled 2012-01-01: qty 4

## 2012-01-01 MED ORDER — MORPHINE SULFATE 4 MG/ML IJ SOLN
4.0000 mg | Freq: Once | INTRAMUSCULAR | Status: AC
  Administered 2012-01-01: 4 mg via INTRAVENOUS
  Filled 2012-01-01: qty 1

## 2012-01-01 MED ORDER — LORAZEPAM 2 MG/ML IJ SOLN
0.5000 mg | Freq: Once | INTRAMUSCULAR | Status: AC
  Administered 2012-01-01: 0.5 mg via INTRAVENOUS
  Filled 2012-01-01: qty 1

## 2012-01-01 NOTE — ED Notes (Signed)
Patient now awake, requesting something to drink. Patient reports improvement in pain and tingling. EDP made aware, to room to talk to patient. Gingerale given per EDP's approval.

## 2012-01-01 NOTE — ED Provider Notes (Signed)
History   This chart was scribed for Melinda Booze, MD by Toya Smothers. The patient was seen in room APA18/APA18. Patient's care was started at 1550.  CSN: [id number]  Arrival date & time 01/01/12  1550   First MD Initiated Contact with Patient 01/01/12 1556      Chief Complaint  Patient presents with  . Shortness of Breath   Patient is a 56 y.o. female presenting with shortness of breath. The history is provided by the patient. No language interpreter was used.  Shortness of Breath  Associated symptoms include shortness of breath.    Melinda Wood is a 56 y.o. female is a current everyday smoker with a h/o asthma, anxiety, and panic attacks presents to the Emergency Department complaining of moderate SOB onset 1 hour ago. SOB has been constant since the sudden onset 1 hour ago, after Pt was walking 2 miles outside for an hour. Pain has described dissimilar to any previous experience and has increased acutely since onset. She denotes associated moderate chest pain and tightness, dry mouth, and mild numbness of hands and legs. Pt denies nausea, emesis, diarrhea, fever, and chills.  Pt lists Dr. Delbert Harness as PCP  Past Medical History  Diagnosis Date  . High cholesterol   . Asthma   . Hypertension   . Bipolar 1 disorder   . Urinary incontinence   . Anxiety   . Panic attacks     Past Surgical History  Procedure Date  . Shoulder surgery   . Appendectomy     History reviewed. No pertinent family history.  History  Substance Use Topics  . Smoking status: Current Some Day Smoker -- 10 years    Types: Cigarettes  . Smokeless tobacco: Never Used  . Alcohol Use: No    OB History    Grav Para Term Preterm Abortions TAB SAB Ect Mult Living   3 3 3       3      Review of Systems  Respiratory: Positive for chest tightness and shortness of breath.   Gastrointestinal: Negative for nausea and vomiting.  Musculoskeletal:       Shoulder Pain  Neurological: Positive for weakness  and numbness (Arms and Legs.).  All other systems reviewed and are negative.    Allergies  Arthrotec and Sulfonamide derivatives  Home Medications   Current Outpatient Rx  Name Route Sig Dispense Refill  . ALPRAZOLAM 0.5 MG PO TABS Oral Take 0.5 mg by mouth daily as needed. For anxiety    . ATOMOXETINE HCL 60 MG PO CAPS Oral Take 60 mg by mouth daily.      Marland Kitchen CALCIUM CARBONATE-VITAMIN D 500-200 MG-UNIT PO TABS Oral Take 1 tablet by mouth 2 (two) times daily.      Marland Kitchen DIVALPROEX SODIUM ER 250 MG PO TB24 Oral Take 250 mg by mouth at bedtime.      Marland Kitchen DIVALPROEX SODIUM ER 500 MG PO TB24 Oral Take 500 mg by mouth at bedtime.      Marland Kitchen FLUTICASONE-SALMETEROL 250-50 MCG/DOSE IN AEPB Inhalation Inhale 1 puff into the lungs every 12 (twelve) hours.      Marland Kitchen OLMESARTAN MEDOXOMIL 40 MG PO TABS Oral Take 40 mg by mouth daily.      Marland Kitchen PAROXETINE HCL 20 MG PO TABS Oral Take 20 mg by mouth every morning.      Marland Kitchen PREGABALIN 50 MG PO CAPS Oral Take 50 mg by mouth at bedtime.      Marland Kitchen RISEDRONATE SODIUM  35 MG PO TBEC Oral Take 1 tablet by mouth once a week. Patient takes on Tuesday!!    . ROSUVASTATIN CALCIUM 10 MG PO TABS Oral Take 10 mg by mouth daily.      . TETRAHYDROZOLINE HCL 0.05 % OP SOLN Both Eyes Place 1 drop into both eyes daily.    Marland Kitchen DETROL PO Oral Take 1 tablet by mouth every morning.    . TRAZODONE HCL 100 MG PO TABS Oral Take 200 mg by mouth at bedtime.     Marland Kitchen VITAMIN B-12 1000 MCG PO TABS Oral Take 1,000 mcg by mouth daily.      Marland Kitchen VITAMIN C 500 MG PO TABS Oral Take 500 mg by mouth at bedtime.       BP 115/72  Pulse 101  Temp 98.2 F (36.8 C) (Oral)  Resp 30  Ht 5\' 4"  (1.626 m)  Wt 110 lb (49.896 kg)  BMI 18.88 kg/m2  SpO2 100%  Physical Exam  Constitutional: She is oriented to person, place, and time.       Apperars uncomfortable. Elderly.  HENT:  Head: Normocephalic and atraumatic.  Right Ear: External ear normal.  Left Ear: External ear normal.  Nose: Nose normal.  Mouth/Throat: No  oropharyngeal exudate.  Eyes: Right eye exhibits no discharge. Left eye exhibits no discharge.  Neck: Normal range of motion. Neck supple. No tracheal deviation present.  Cardiovascular: Normal rate, regular rhythm and normal heart sounds.   No murmur heard. Pulmonary/Chest: Effort normal and breath sounds normal. No respiratory distress. She has no wheezes. She has no rales.       Sounds are distant.  Abdominal: Soft. Bowel sounds are normal. She exhibits no distension. There is no tenderness. There is no rebound and no guarding.  Musculoskeletal: Normal range of motion. She exhibits no edema and no tenderness.  Lymphadenopathy:    She has no cervical adenopathy.  Neurological: She is alert and oriented to person, place, and time. No cranial nerve deficit. Coordination normal.  Skin: Skin is warm and dry. No erythema.    ED Course  Procedures (including critical care time) DIAGNOSTIC STUDIES: Oxygen Saturation is 100% on [place], normal by my interpretation.    COORDINATION OF CARE: 1601- Evaluated Pt. Pt is with mild distress. Currently on [place]. 1602- Ordered DG Chest Portable 1 View 1 time. 1615- Ordered aspirin chewable tablet 324 mg Once. 1615- Ordered sodium chloride 0.9 % bolus 1,000 mL Once. 1615- Ordered 0.9 % sodium chloride infusion Once.  Results for orders placed during the hospital encounter of 01/01/12  CBC WITH DIFFERENTIAL      Component Value Range   WBC 8.3  4.0 - 10.5 K/uL   RBC 3.66 (*) 3.87 - 5.11 MIL/uL   Hemoglobin 11.8 (*) 12.0 - 15.0 g/dL   HCT 47.8 (*) 29.5 - 62.1 %   MCV 93.7  78.0 - 100.0 fL   MCH 32.2  26.0 - 34.0 pg   MCHC 34.4  30.0 - 36.0 g/dL   RDW 30.8  65.7 - 84.6 %   Platelets 136 (*) 150 - 400 K/uL   Neutrophils Relative 62  43 - 77 %   Neutro Abs 5.2  1.7 - 7.7 K/uL   Lymphocytes Relative 31  12 - 46 %   Lymphs Abs 2.6  0.7 - 4.0 K/uL   Monocytes Relative 5  3 - 12 %   Monocytes Absolute 0.4  0.1 - 1.0 K/uL   Eosinophils Relative 1  0 -  5 %    Eosinophils Absolute 0.0  0.0 - 0.7 K/uL   Basophils Relative 1  0 - 1 %   Basophils Absolute 0.1  0.0 - 0.1 K/uL  BASIC METABOLIC PANEL      Component Value Range   Sodium 135  135 - 145 mEq/L   Potassium 3.5  3.5 - 5.1 mEq/L   Chloride 101  96 - 112 mEq/L   CO2 20  19 - 32 mEq/L   Glucose, Bld 113 (*) 70 - 99 mg/dL   BUN 12  6 - 23 mg/dL   Creatinine, Ser 3.08  0.50 - 1.10 mg/dL   Calcium 65.7  8.4 - 84.6 mg/dL   GFR calc non Af Amer 57 (*) >90 mL/min   GFR calc Af Amer 66 (*) >90 mL/min  TROPONIN I      Component Value Range   Troponin I <0.30  <0.30 ng/mL  D-DIMER, QUANTITATIVE      Component Value Range   D-Dimer, Quant <0.22  0.00 - 0.48 ug/mL-FEU  LACTIC ACID, PLASMA      Component Value Range   Lactic Acid, Venous 4.8 (*) 0.5 - 2.2 mmol/L  PROCALCITONIN      Component Value Range   Procalcitonin <0.05    TROPONIN I      Component Value Range   Troponin I <0.30  <0.30 ng/mL  LACTIC ACID, PLASMA      Component Value Range   Lactic Acid, Venous 1.7  0.5 - 2.2 mmol/L   Dg Chest Portable 1 View  01/01/2012  *RADIOLOGY REPORT*  Clinical Data: Shortness of breath, chest pain  PORTABLE CHEST - 1 VIEW  Comparison: 08/05/2011  Findings: Lungs are essentially clear. No pleural effusion or pneumothorax.  Cardiomediastinal silhouette is within normal limits.  Status post ORIF of the proximal left humerus.  IMPRESSION: No evidence of acute cardiopulmonary disease.  Original Report Authenticated By: Charline Bills, M.D.    Images viewed by me. Date: 01/01/2012  Rate: 94  Rhythm: normal sinus rhythm  QRS Axis: normal  Intervals: QT prolonged  ST/T Wave abnormalities: normal  Conduction Disutrbances:none  Narrative Interpretation: Left atrial margin, prolonged QT interval. No prior ECG available for comparison.  Old EKG Reviewed: none available    1. Dyspnea   2. Hypotension       MDM  Acute episode of dyspnea. Patient seemed to have some elements of  hyperventilation with generalized numbness, however, she is noted to be significantly hypotensive. IV fluids were given. Laboratory workup shows significant elevation of blood lactate to 4.8. Where his blood pressure was stabilized, she was given a small dose of morphine and a small dose of lorazepam and she feels much better following this. Repeat lactic acid shows good clearance in lactate is back to normal. Workup is negative for evidence of cardiac injury, pulmonaryry embolism, and pneumonia. At this point, she appears clinically well and I will discharge her although reason for hypotension and dyspnea is unclear.    I personally performed the services described in this documentation, which was scribed in my presence. The recorded information has been reviewed and considered.        Melinda Booze, MD 01/01/12 [phone]

## 2012-01-01 NOTE — ED Notes (Signed)
Patient brought in via EMS. Alert and oriented. Airway patent. Patient c/o shortness of breath and tingling all over. Per EMS patient walked 2 miles from grocery store with a load of groceries when she started getting short of breath. Per EMS patient was hyperventilating when they arrived on scene. O2 sat 100%

## 2012-02-09 ENCOUNTER — Emergency Department (HOSPITAL_COMMUNITY)
Admission: EM | Admit: 2012-02-09 | Discharge: 2012-02-10 | Disposition: A | Discharge status: Other Acute Inpatient Hospital | Payer: PRIVATE HEALTH INSURANCE | Attending: Emergency Medicine | Admitting: Emergency Medicine

## 2012-02-09 ENCOUNTER — Encounter (HOSPITAL_COMMUNITY): Payer: Self-pay | Admitting: Emergency Medicine

## 2012-02-09 DIAGNOSIS — I10 Essential (primary) hypertension: Secondary | ICD-10-CM | POA: Insufficient documentation

## 2012-02-09 DIAGNOSIS — F200 Paranoid schizophrenia: Secondary | ICD-10-CM | POA: Insufficient documentation

## 2012-02-09 DIAGNOSIS — F22 Delusional disorders: Secondary | ICD-10-CM

## 2012-02-09 DIAGNOSIS — F319 Bipolar disorder, unspecified: Secondary | ICD-10-CM | POA: Insufficient documentation

## 2012-02-09 DIAGNOSIS — Z79899 Other long term (current) drug therapy: Secondary | ICD-10-CM | POA: Insufficient documentation

## 2012-02-09 LAB — CBC WITH DIFFERENTIAL/PLATELET
Basophils Absolute: 0.1 10*3/uL (ref 0.0–0.1)
Basophils Relative: 1 % (ref 0–1)
Lymphocytes Relative: 23 % (ref 12–46)
MCHC: 33 g/dL (ref 30.0–36.0)
Neutro Abs: 7.2 10*3/uL (ref 1.7–7.7)
Neutrophils Relative %: 70 % (ref 43–77)
Platelets: 119 10*3/uL — ABNORMAL LOW (ref 150–400)
RDW: 13 % (ref 11.5–15.5)
WBC: 10.2 10*3/uL (ref 4.0–10.5)

## 2012-02-09 LAB — BASIC METABOLIC PANEL
BUN: 22 mg/dL (ref 6–23)
Chloride: 100 mEq/L (ref 96–112)
Creatinine, Ser: 1 mg/dL (ref 0.50–1.10)
GFR calc Af Amer: 71 mL/min — ABNORMAL LOW (ref >90)
GFR calc non Af Amer: 61 mL/min — ABNORMAL LOW (ref >90)
Potassium: 5.2 mEq/L — ABNORMAL HIGH (ref 3.5–5.1)

## 2012-02-09 LAB — URINALYSIS, ROUTINE W REFLEX MICROSCOPIC
Nitrite: NEGATIVE
Specific Gravity, Urine: >1.03 — ABNORMAL HIGH (ref 1.005–1.030)
Urobilinogen, UA: 0.2 mg/dL (ref 0.0–1.0)
pH: 5.5 (ref 5.0–8.0)

## 2012-02-09 LAB — URINE MICROSCOPIC-ADD ON

## 2012-02-09 LAB — RAPID URINE DRUG SCREEN, HOSP PERFORMED
Barbiturates: NOT DETECTED
Cocaine: NOT DETECTED
Opiates: NOT DETECTED

## 2012-02-09 MED ORDER — KETOROLAC TROMETHAMINE 30 MG/ML IJ SOLN
60.0000 mg | Freq: Once | INTRAMUSCULAR | Status: AC
  Administered 2012-02-09: 60 mg via INTRAMUSCULAR
  Filled 2012-02-09: qty 2

## 2012-02-09 MED ORDER — OXYCODONE-ACETAMINOPHEN 5-325 MG PO TABS
1.0000 | ORAL_TABLET | Freq: Once | ORAL | Status: AC
  Administered 2012-02-09: 1 via ORAL
  Filled 2012-02-09: qty 1

## 2012-02-09 MED ORDER — LORAZEPAM 1 MG PO TABS
1.0000 mg | ORAL_TABLET | Freq: Three times a day (TID) | ORAL | Status: DC | PRN
  Administered 2012-02-09: 1 mg via ORAL
  Filled 2012-02-09 (×2): qty 1

## 2012-02-09 MED ORDER — NICOTINE 21 MG/24HR TD PT24: 21.0000 mg | MEDICATED_PATCH | Freq: Every day | TRANSDERMAL | Status: DC

## 2012-02-09 MED ORDER — ACETAMINOPHEN 325 MG PO TABS
650.0000 mg | ORAL_TABLET | ORAL | Status: DC | PRN
  Filled 2012-02-09: qty 2

## 2012-02-09 MED ORDER — ONDANSETRON HCL 4 MG PO TABS: 4.0000 mg | ORAL_TABLET | Freq: Three times a day (TID) | ORAL | Status: DC | PRN

## 2012-02-09 MED ORDER — LORAZEPAM 1 MG PO TABS
1.0000 mg | ORAL_TABLET | Freq: Once | ORAL | Status: AC
  Administered 2012-02-09: 1 mg via ORAL
  Filled 2012-02-09: qty 1

## 2012-02-09 NOTE — ED Notes (Signed)
Pt wanting something additional for pain but states she doe not want any more percocet that they do not work.
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
## 2012-02-09 NOTE — ED Notes (Signed)
Pt requesting muscle rub and heating pad for shoulder, requesting that percocet "be increased"; states 5mg  does not work for her; pt requesting the phone to call landlord.  Informed pt that she cannot use phone to call landlord at this time.
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
## 2012-02-09 NOTE — ED Notes (Signed)
Pt reports that if she cannot get some comfort here, she will "just go to another hospital and see if they can help me".  EDP notified of pt request for pain injection.

## 2012-02-09 NOTE — ED Notes (Signed)
Requesting something for shoulder pain and "my nerves".  Pt is tremulous and appears anxious.  States "the doctor said he would give me something for my shoulder and my nerves.".  EDP aware and orders rec'd.

## 2012-02-09 NOTE — ED Notes (Signed)
Pt resting at this time, sitter remains at bedside. NAD noted.
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
## 2012-02-09 NOTE — ED Notes (Signed)
Attempted to medicate pt w/ tylenol & ativan as per orders. Pt states she was not takeing the tylenol & she was leaving going to get a ride to [employer]. Advised pt that there is someone here from ACT team to see her. Pt ok w/ waiting to see ACT member.

## 2012-02-09 NOTE — ED Notes (Signed)
Pt requesting crackers & peanut butter at this time. Pt advocate notified.

## 2012-02-10 ENCOUNTER — Encounter (HOSPITAL_COMMUNITY): Payer: Self-pay | Admitting: *Deleted

## 2012-02-10 ENCOUNTER — Inpatient Hospital Stay (HOSPITAL_COMMUNITY)
Admission: EM | Admit: 2012-02-10 | Discharge: 2012-02-16 | DRG: 885 | Disposition: A | Discharge status: Home / Self Care | Payer: 59 | Source: Ambulatory Visit | Attending: Psychiatry | Admitting: Psychiatry

## 2012-02-10 DIAGNOSIS — F259 Schizoaffective disorder, unspecified: Principal | ICD-10-CM | POA: Diagnosis present

## 2012-02-10 DIAGNOSIS — J45909 Unspecified asthma, uncomplicated: Secondary | ICD-10-CM | POA: Diagnosis present

## 2012-02-10 DIAGNOSIS — E875 Hyperkalemia: Secondary | ICD-10-CM

## 2012-02-10 DIAGNOSIS — E7800 Pure hypercholesterolemia, unspecified: Secondary | ICD-10-CM | POA: Diagnosis present

## 2012-02-10 DIAGNOSIS — F250 Schizoaffective disorder, bipolar type: Secondary | ICD-10-CM

## 2012-02-10 DIAGNOSIS — Z79899 Other long term (current) drug therapy: Secondary | ICD-10-CM

## 2012-02-10 DIAGNOSIS — I10 Essential (primary) hypertension: Secondary | ICD-10-CM

## 2012-02-10 DIAGNOSIS — F411 Generalized anxiety disorder: Secondary | ICD-10-CM | POA: Diagnosis present

## 2012-02-10 MED ORDER — DIVALPROEX SODIUM ER 500 MG PO TB24
750.0000 mg | ORAL_TABLET | Freq: Every day | ORAL | Status: DC
  Filled 2012-02-10 (×3): qty 1

## 2012-02-10 MED ORDER — OLANZAPINE 5 MG PO TABS
5.0000 mg | ORAL_TABLET | Freq: Once | ORAL | Status: AC
  Administered 2012-02-10: 5 mg via ORAL
  Filled 2012-02-10: qty 1

## 2012-02-10 MED ORDER — VITAMIN C 500 MG PO TABS
500.0000 mg | ORAL_TABLET | Freq: Every day | ORAL | Status: DC
  Administered 2012-02-10 – 2012-02-16 (×7): 500 mg via ORAL
  Filled 2012-02-10 (×8): qty 1
  Filled 2012-02-10: qty 7

## 2012-02-10 MED ORDER — MAGNESIUM HYDROXIDE 400 MG/5ML PO SUSP
30.0000 mL | Freq: Every day | ORAL | Status: DC | PRN
  Administered 2012-02-14: 30 mL via ORAL

## 2012-02-10 MED ORDER — PREGABALIN 50 MG PO CAPS
50.0000 mg | ORAL_CAPSULE | Freq: Every day | ORAL | Status: DC
  Administered 2012-02-10: 50 mg via ORAL
  Filled 2012-02-10: qty 1

## 2012-02-10 MED ORDER — PAROXETINE HCL 30 MG PO TABS
30.0000 mg | ORAL_TABLET | Freq: Every day | ORAL | Status: DC
  Administered 2012-02-10: 30 mg via ORAL
  Filled 2012-02-10 (×3): qty 1

## 2012-02-10 MED ORDER — ACETAMINOPHEN 325 MG PO TABS
650.0000 mg | ORAL_TABLET | Freq: Four times a day (QID) | ORAL | Status: DC | PRN
  Administered 2012-02-10 – 2012-02-16 (×5): 650 mg via ORAL

## 2012-02-10 MED ORDER — LORAZEPAM 1 MG PO TABS
2.0000 mg | ORAL_TABLET | Freq: Four times a day (QID) | ORAL | Status: DC | PRN
  Administered 2012-02-10 – 2012-02-14 (×12): 2 mg via ORAL
  Filled 2012-02-10: qty 1
  Filled 2012-02-10 (×5): qty 2
  Filled 2012-02-10 (×3): qty 1
  Filled 2012-02-10 (×5): qty 2

## 2012-02-10 MED ORDER — PAROXETINE HCL 20 MG PO TABS
40.0000 mg | ORAL_TABLET | Freq: Every day | ORAL | Status: DC
  Administered 2012-02-11 – 2012-02-16 (×6): 40 mg via ORAL
  Filled 2012-02-10: qty 2
  Filled 2012-02-10: qty 28
  Filled 2012-02-10: qty 1
  Filled 2012-02-10 (×6): qty 2

## 2012-02-10 MED ORDER — TRAZODONE HCL 100 MG PO TABS
200.0000 mg | ORAL_TABLET | Freq: Every day | ORAL | Status: DC
  Filled 2012-02-10 (×2): qty 2

## 2012-02-10 MED ORDER — MELOXICAM 7.5 MG PO TABS
7.5000 mg | ORAL_TABLET | Freq: Every day | ORAL | Status: DC
  Administered 2012-02-11 – 2012-02-16 (×6): 7.5 mg via ORAL
  Filled 2012-02-10 (×8): qty 1
  Filled 2012-02-10: qty 7

## 2012-02-10 MED ORDER — DIVALPROEX SODIUM ER 500 MG PO TB24
750.0000 mg | ORAL_TABLET | Freq: Every day | ORAL | Status: DC
  Administered 2012-02-10 – 2012-02-14 (×5): 750 mg via ORAL
  Filled 2012-02-10 (×6): qty 1

## 2012-02-10 MED ORDER — FLUTICASONE-SALMETEROL 250-50 MCG/DOSE IN AEPB
1.0000 | INHALATION_SPRAY | Freq: Two times a day (BID) | RESPIRATORY_TRACT | Status: DC
  Administered 2012-02-10 – 2012-02-16 (×12): 1 via RESPIRATORY_TRACT
  Filled 2012-02-10 (×3): qty 14

## 2012-02-10 MED ORDER — NAPHAZOLINE HCL 0.1 % OP SOLN
1.0000 [drp] | Freq: Every day | OPHTHALMIC | Status: DC
  Administered 2012-02-11 – 2012-02-16 (×6): 1 [drp] via OPHTHALMIC
  Filled 2012-02-10: qty 15

## 2012-02-10 MED ORDER — TRAZODONE HCL 100 MG PO TABS
100.0000 mg | ORAL_TABLET | Freq: Every day | ORAL | Status: DC
  Administered 2012-02-10 – 2012-02-15 (×6): 100 mg via ORAL
  Filled 2012-02-10 (×4): qty 1
  Filled 2012-02-10: qty 14
  Filled 2012-02-10 (×3): qty 1

## 2012-02-10 MED ORDER — OLANZAPINE 5 MG PO TABS
5.0000 mg | ORAL_TABLET | Freq: Four times a day (QID) | ORAL | Status: DC | PRN
  Filled 2012-02-10: qty 1

## 2012-02-10 MED ORDER — CALCIUM CARBONATE-VITAMIN D 500-200 MG-UNIT PO TABS
1.0000 | ORAL_TABLET | ORAL | Status: DC
  Administered 2012-02-10 – 2012-02-16 (×12): 1 via ORAL
  Filled 2012-02-10: qty 1
  Filled 2012-02-10: qty 7
  Filled 2012-02-10 (×12): qty 1
  Filled 2012-02-10: qty 7
  Filled 2012-02-10 (×2): qty 1

## 2012-02-10 MED ORDER — ALUM & MAG HYDROXIDE-SIMETH 200-200-20 MG/5ML PO SUSP: 30.0000 mL | ORAL | Status: DC | PRN

## 2012-02-10 MED ORDER — IRBESARTAN 300 MG PO TABS
300.0000 mg | ORAL_TABLET | Freq: Every day | ORAL | Status: DC
  Administered 2012-02-10 – 2012-02-15 (×6): 300 mg via ORAL
  Filled 2012-02-10 (×7): qty 1

## 2012-02-10 MED ORDER — ATORVASTATIN CALCIUM 10 MG PO TABS
20.0000 mg | ORAL_TABLET | Freq: Every day | ORAL | Status: DC
  Administered 2012-02-10 – 2012-02-15 (×6): 20 mg via ORAL
  Filled 2012-02-10 (×4): qty 1
  Filled 2012-02-10: qty 14
  Filled 2012-02-10 (×3): qty 1

## 2012-02-10 MED ORDER — ENSURE COMPLETE PO LIQD
237.0000 mL | Freq: Three times a day (TID) | ORAL | Status: DC
  Administered 2012-02-10 – 2012-02-16 (×18): 237 mL via ORAL

## 2012-02-10 MED ORDER — ATORVASTATIN CALCIUM 20 MG PO TABS
20.0000 mg | ORAL_TABLET | Freq: Every day | ORAL | Status: DC
  Filled 2012-02-10 (×2): qty 1

## 2012-02-10 MED ORDER — PREGABALIN 50 MG PO CAPS
50.0000 mg | ORAL_CAPSULE | ORAL | Status: DC
  Administered 2012-02-10 – 2012-02-11 (×4): 50 mg via ORAL
  Filled 2012-02-10 (×5): qty 1

## 2012-02-10 MED ORDER — CALCIUM CARBONATE-VITAMIN D 500-200 MG-UNIT PO TABS
1.0000 | ORAL_TABLET | Freq: Two times a day (BID) | ORAL | Status: DC
  Administered 2012-02-10: 1 via ORAL
  Filled 2012-02-10 (×5): qty 1

## 2012-02-10 MED ORDER — ENSURE COMPLETE PO LIQD: 237.0000 mL | Freq: Every day | ORAL | Status: DC

## 2012-02-10 MED ORDER — VITAMIN B-12 1000 MCG PO TABS
1000.0000 ug | ORAL_TABLET | Freq: Every day | ORAL | Status: DC
  Administered 2012-02-10 – 2012-02-16 (×7): 1000 ug via ORAL
  Filled 2012-02-10: qty 7
  Filled 2012-02-10 (×8): qty 1

## 2012-02-10 MED ORDER — FESOTERODINE FUMARATE ER 4 MG PO TB24
4.0000 mg | ORAL_TABLET | Freq: Every day | ORAL | Status: DC
  Administered 2012-02-10 – 2012-02-16 (×7): 4 mg via ORAL
  Filled 2012-02-10 (×8): qty 1
  Filled 2012-02-10: qty 7

## 2012-02-10 MED ORDER — OLANZAPINE 5 MG PO TABS
5.0000 mg | ORAL_TABLET | Freq: Every day | ORAL | Status: DC
  Administered 2012-02-10 – 2012-02-13 (×4): 5 mg via ORAL
  Filled 2012-02-10 (×7): qty 1

## 2012-02-10 MED ORDER — BOOST / RESOURCE BREEZE PO LIQD: 1.0000 | Freq: Every day | ORAL | Status: DC

## 2012-02-10 NOTE — ED Provider Notes (Signed)
Pt accepted to [facility], will transfer stable.   Laray Anger, DO 02/10/12 [phone]
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
## 2012-02-10 NOTE — ED Notes (Signed)
Left w/ [employer] on her way to [facility].
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
## 2012-02-10 NOTE — Progress Notes (Signed)
D   Pt has been in bed most of the shift   She reports she is hungry and has eaten all of her meals and snacks  She is very shakey and staggers when she gets up    She is disorganized and a little confused but follows simple instruction well   A   Pt instructed on fall risk measures  Verbal support given   Medications administered and effectiveness monitored  Pt provided with extra snacks and ensure   Q 15 min checks R   Pt safe at present and verbalized understanding of fall risk measures
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
## 2012-02-10 NOTE — Tx Team (Signed)
Interdisciplinary Treatment Plan Update (Adult) Date: 02/10/2012  Time Reviewed: 9:23 AM  Progress in Treatment: Attending groups: Yes Participating in groups: Yes Taking medication as prescribed: Yes Tolerating medication: Yes Family/Significant othe contact made:  Patient understands diagnosis: Yes Discussing patient identified problems/goals with staff: Yes Medical problems stabilized or resolved: Yes Denies suicidal/homicidal ideation: Yes Issues/concerns per patient self-inventory: None identified Other: N/A New problem(s) identified: None Identified Reason for Continuation of Hospitalization: Delusions  Medication stabilization Interventions implemented related to continuation of hospitalization: mood stabilization, medication monitoring and adjustment, group therapy and psycho education, safety checks q 15 mins Additional comments: N/A Estimated length of stay:  Discharge Plan: SW is assessing for appropriate referrals.  New goal(s): N/A Review of initial/current patient goals per problem list:  1. Goal(s): Reduce depressive symptoms  Met: No  Target date: by discharge  As evidenced by: Reducing depression from a 10 to a 3 as reported by pt.  2. Goal (s): Eliminate Suicidal Ideation  Met: No  Target date: by discharge  As evidenced by: Eliminate suicidal ideation.  3. Goal(s): Reduce Psychosis  Met: No  Target date: by discharge  As evidenced by: Reduce psychotic symptoms to baseline, as reported by pt.  4. Goal(s):  Met: No  Target date: by discharge  As evidenced by: Attendees: [name] 02/10/2012 9:24 AM  Family:    Physician: Franchot Gallo, MD 02/10/2012 9:23 AM   Nursing:    Case Manager: Clarice Pole, LCASA 02/10/2012 9:23 AM   Counselor: Veto Kemps, MT-BC 02/10/2012 9:23 AM   Other:  02/10/2012 9:23 AM   Other:    Other:    Other:    Scribe for Treatment Team:  Clarice Pole, LCASA 02/10/2012 9:23 AM
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
## 2012-02-10 NOTE — Discharge Planning (Addendum)
02/10/2012  Pt did not attend d/c planning group on this date. SW met with pt individually at this time.   SW met with pt.  Pt states she is seen at [facility] in [facility] [phone].  SW called to try to schedule an appt and they are to call back.  Pt plans to return home.  SW to continue to monitor.  Clarice Pole, LCASA 02/10/2012, 3:46 PM

## 2012-02-10 NOTE — ED Notes (Signed)
Pt accepted to [facility] by dr readling. Room 401-2. Call report to [phone].

## 2012-02-10 NOTE — Progress Notes (Signed)
Nutrition Note  Reason: Nutrition Risk for Unintentional Weight Loss  Patient appears thin. Patient reported her appetite is well. However, RN tech reported patient is eating poorly. Patient has been sleeping and has not yet eaten lunch meal. She reported she was not eating well PTA. She reported she would go 3 days without eating at a time due to not feeling hungry.   Wt Readings from Last 10 Encounters:  01/01/12 110 lb (49.896 kg)  08/05/11 120 lb (54.432 kg)  03/14/11 113 lb (51.256 kg)  03/12/11 113 lb (51.256 kg)  02/12/08 170 lb (77.111 kg)  Height: 5'4" per pt.  Weight: 110 lb.Marland Kitchen  BMI: 18.87 kg/m^2 (WNL)  I have encouraged the patient to have adequate PO intake. I have educated the patient on good sources of protein. The patient voiced snack preferences and agreed to drink nutrition supplements BID.   Recommend provide patient snacks of peanut butter and crackers at snack times.   Will order patient 1 Ensure nutrition supplement daily, provides 250 kcal and 9 grams of protein. Will also order patient 1 Resource Breeze nutrition supplement daily, provides 250 kcal and 9 grams of protein daily.   RD available for nutrition needs.   Iven Finn [facility] [phone]

## 2012-02-11 DIAGNOSIS — F259 Schizoaffective disorder, unspecified: Principal | ICD-10-CM

## 2012-02-11 LAB — TSH: TSH: 0.433 u[IU]/mL (ref 0.350–4.500)

## 2012-02-11 LAB — T4, FREE: Free T4: 0.65 ng/dL — ABNORMAL LOW (ref 0.80–1.80)

## 2012-02-11 LAB — T3, FREE: T3, Free: 2.3 pg/mL (ref 2.3–4.2)

## 2012-02-11 MED ORDER — NICOTINE 14 MG/24HR TD PT24
MEDICATED_PATCH | TRANSDERMAL | Status: AC
  Administered 2012-02-11: 15:00:00
  Filled 2012-02-11: qty 1

## 2012-02-11 MED ORDER — PREGABALIN 50 MG PO CAPS
100.0000 mg | ORAL_CAPSULE | Freq: Three times a day (TID) | ORAL | Status: DC
  Administered 2012-02-11: 50 mg via ORAL
  Administered 2012-02-12: 100 mg via ORAL
  Filled 2012-02-11: qty 2

## 2012-02-11 NOTE — Progress Notes (Signed)
[facility] Group Notes:  (Counselor/Nursing/MHT/Case Management/Adjunct)  02/11/2012 8:12 AM  Type of Therapy:  Group Therapy  11:00, Music Therapy 1:15  Participation Level:  Did Not Attend    Melinda Wood 02/11/2012, 8:12 AM

## 2012-02-11 NOTE — Progress Notes (Signed)
[facility] Group Notes:  (Counselor/Nursing/MHT/Case Management/Adjunct)  02/11/2012 11:41 AM  Type of Therapy:  Group Therapy  Participation Level:  Did Not Attend    Melinda Wood 02/11/2012, 11:41 AM
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
## 2012-02-11 NOTE — Progress Notes (Signed)
[facility] MD Progress Note  02/11/2012 12:36 PM  S: "I'm doing okay, just sleepy. I have been up, bath, gone to eat breakfast and cleaned my room. My mood is better. I just would want my family to have my pass code so that they can reach me here. No thoughts to hurt myself or others".  Diagnosis:   Axis I: Schizoaffective disorder, bipolar type Axis II: Deferred Axis III:  Past Medical History  Diagnosis Date  . High cholesterol   . Asthma   . Hypertension   . Bipolar 1 disorder   . Urinary incontinence   . Anxiety   . Panic attacks    Axis IV: other psychosocial or environmental problems Axis V: 41-50 serious symptoms  ADL's:  Intact  Sleep: Good  Appetite:  Good  Suicidal Ideation: "No" Plan:  No Intent:  No Means:  no Homicidal Ideation: "No" Plan:  No Intent:  No Means:  No  AEB (as evidenced by): Per patient's reports.  Mental Status Examination/Evaluation: Objective:  Appearance: Casual  Eye Contact::  Good  Speech:  Clear and Coherent  Volume:  Normal  Mood:  "My mood is better"  Affect:  Appropriate  Thought Process:  Coherent and Intact  Orientation:  Full  Thought Content:  Denies auditory and or visual hallucinations, denies delusional thinking.  Suicidal Thoughts:  No  Homicidal Thoughts:  No  Memory:  Immediate;   Good Recent;   Good Remote;   Good  Judgement:  Fair  Insight:  Fair  Psychomotor Activity:  Normal  Concentration:  Good  Recall:  Good  Akathisia:  No  Handed:  Right  AIMS (if indicated):     Assets:  Desire for Improvement  Sleep:  Number of Hours: 6.5    Vital Signs:Blood pressure 102/69, pulse 70, temperature 98.4 F (36.9 C), temperature source Oral, resp. rate 18. Current Medications: Current Facility-Administered Medications  Medication Dose Route Frequency Provider Last Rate Last Dose  . acetaminophen (TYLENOL) tablet 650 mg  650 mg Oral Q6H PRN Jorje Guild, PA-C   650 mg at 02/10/12 1855  . alum & mag hydroxide-simeth  (MAALOX/MYLANTA) 200-200-20 MG/5ML suspension 30 mL  30 mL Oral Q4H PRN Jorje Guild, PA-C      . atorvastatin (LIPITOR) tablet 20 mg  20 mg Oral QHS Curlene Labrum Readling, MD   20 mg at 02/10/12 2134  . calcium-vitamin D (OSCAL WITH D) 500-200 MG-UNIT per tablet 1 tablet  1 tablet Oral BH-qamhs Ronny Bacon, MD   1 tablet at 02/11/12 0758  . divalproex (DEPAKOTE ER) 24 hr tablet 750 mg  750 mg Oral QHS Curlene Labrum Readling, MD   750 mg at 02/10/12 2135  . feeding supplement (ENSURE COMPLETE) liquid 237 mL  237 mL Oral TID BM Curlene Labrum Readling, MD   237 mL at 02/11/12 1000  . fesoterodine (TOVIAZ) tablet 4 mg  4 mg Oral Daily Curlene Labrum Readling, MD   4 mg at 02/11/12 0758  . Fluticasone-Salmeterol (ADVAIR) 250-50 MCG/DOSE inhaler 1 puff  1 puff Inhalation BID Ronny Bacon, MD   1 puff at 02/11/12 0758  . irbesartan (AVAPRO) tablet 300 mg  300 mg Oral Daily Curlene Labrum Readling, MD   300 mg at 02/11/12 0758  . LORazepam (ATIVAN) tablet 2 mg  2 mg Oral Q6H PRN Jorje Guild, PA-C   2 mg at 02/11/12 0804  . magnesium hydroxide (MILK OF MAGNESIA) suspension 30 mL  30 mL Oral Daily PRN  Jorje Guild, PA-C      . meloxicam [facility]) tablet 7.5 mg  7.5 mg Oral Daily Curlene Labrum Readling, MD   7.5 mg at 02/11/12 0758  . naphazoline (NAPHCON) 0.1 % ophthalmic solution 1 drop  1 drop Both Eyes Daily Curlene Labrum Readling, MD   1 drop at 02/11/12 0759  . OLANZapine (ZYPREXA) tablet 5 mg  5 mg Oral QHS Curlene Labrum Readling, MD   5 mg at 02/10/12 2135  . PARoxetine (PAXIL) tablet 40 mg  40 mg Oral Daily Curlene Labrum Readling, MD   40 mg at 02/11/12 0759  . pregabalin (LYRICA) capsule 50 mg  50 mg Oral [id number] Curlene Labrum Readling, MD   50 mg at 02/11/12 0758  . traZODone (DESYREL) tablet 100 mg  100 mg Oral QHS Curlene Labrum Readling, MD   100 mg at 02/10/12 2135  . vitamin B-12 (CYANOCOBALAMIN) tablet 1,000 mcg  1,000 mcg Oral Daily Curlene Labrum Readling, MD   1,000 mcg at 02/11/12 0758  . vitamin C (ASCORBIC ACID) tablet 500 mg  500 mg Oral Daily Curlene Labrum  Readling, MD   500 mg at 02/11/12 0759  . DISCONTD: atorvastatin (LIPITOR) tablet 20 mg  20 mg Oral q1800 Jorje Guild, PA-C      . DISCONTD: calcium-vitamin D (OSCAL WITH D) 500-200 MG-UNIT per tablet 1 tablet  1 tablet Oral BID Jorje Guild, PA-C   1 tablet at 02/10/12 0834  . DISCONTD: divalproex (DEPAKOTE ER) 24 hr tablet 750 mg  750 mg Oral Daily Jorje Guild, PA-C      . DISCONTD: feeding supplement (ENSURE COMPLETE) liquid 237 mL  237 mL Oral Daily Anastasio Champion, RD      . DISCONTD: feeding supplement (RESOURCE BREEZE) liquid 1 Container  1 Container Oral QPC lunch Anastasio Champion, RD      . DISCONTD: OLANZapine (ZYPREXA) tablet 5 mg  5 mg Oral Q6H PRN Jorje Guild, PA-C      . DISCONTD: PARoxetine (PAXIL) tablet 30 mg  30 mg Oral Daily Jorje Guild, PA-C   30 mg at 02/10/12 0834  . DISCONTD: pregabalin (LYRICA) capsule 50 mg  50 mg Oral Daily Jorje Guild, PA-C   50 mg at 02/10/12 0841  . DISCONTD: traZODone (DESYREL) tablet 200 mg  200 mg Oral QHS Jorje Guild, PA-C        Lab Results:  Results for orders placed during the hospital encounter of 02/10/12 (from the past 48 hour(s))  TSH     Status: Normal   Collection Time   02/10/12  8:01 PM      Component Value Range Comment   TSH 0.433  0.350 - 4.500 uIU/mL   T3, FREE     Status: Normal   Collection Time   02/10/12  8:01 PM      Component Value Range Comment   T3, Free 2.3  2.3 - 4.2 pg/mL   T4, FREE     Status: Abnormal   Collection Time   02/10/12  8:01 PM      Component Value Range Comment   Free T4 0.65 (*) 0.80 - 1.80 ng/dL     Physical Findings: AIMS: Facial and Oral Movements Muscles of Facial Expression: None, normal Lips and Perioral Area: None, normal Jaw: None, normal Tongue: None, normal,Extremity Movements Upper (arms, wrists, hands, fingers): None, normal Lower (legs, knees, ankles, toes): None, normal, Trunk Movements Neck, shoulders, hips: None, normal, Overall Severity Severity of abnormal movements (highest score  from questions above): None, normal Incapacitation due to abnormal movements: None, normal Patient's awareness of abnormal movements (rate only patient's report): No Awareness, Dental Status Current problems with teeth and/or dentures?: No Does patient usually wear dentures?: No  CIWA:    COWS:     Treatment Plan Summary: Daily contact with patient to assess and evaluate symptoms and progress in treatment Medication management  Plan: No new changes made on the current treatment regimen. Continue with current treatment plans.  Armandina Stammer I 02/11/2012, 12:36 PM

## 2012-02-11 NOTE — Progress Notes (Signed)
02/11/2012         Time: 0930      Group Topic/Focus: The focus of the group is on enhancing the patients' ability to cope with stressors by understanding what coping is, why it is important, the negative effects of stress and developing healthier coping skills. Patients practice Qi Gong and discuss how exercise can be used as a healthy coping strategy.  Participation Level: Did not attend  Participation Quality: Not Applicable  Affect: Not Applicable  Cognitive: Not Applicable   Additional Comments: Patient in bed, sleeping.    Melinda Wood 02/11/2012 10:19 AM  
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
## 2012-02-12 MED ORDER — PREGABALIN 50 MG PO CAPS
50.0000 mg | ORAL_CAPSULE | Freq: Three times a day (TID) | ORAL | Status: DC
  Administered 2012-02-12 – 2012-02-16 (×13): 50 mg via ORAL
  Filled 2012-02-12 (×8): qty 1
  Filled 2012-02-12: qty 9
  Filled 2012-02-12 (×5): qty 1

## 2012-02-12 NOTE — Progress Notes (Signed)
D   Pt isolates to her room  She takes her medications but does not attend group   She appears depressed and anxious   Continues to say we are not giving her enough pain medications but denies pain on last assessment   A   Verbal support given   Medications administered and effectiveness monitored   Q 15 min checks R   Pt safe at present

## 2012-02-12 NOTE — Progress Notes (Signed)
Patient ID: Melinda Wood, female   DOB: [birth year], 56 y.o.   MRN: [id number] Began the shift in pain, stated she had had surgery on her left shoulder, a rod and 12 screws had been put in, but states it feels like a screw may be working itself loose.   Requested something for pain, was offered tylenol, but wanted something stronger.  Dr. Dan Humphreys made aware, order given to increase Lyrica to 100 mg tid, additional 50 mg was given tonight to round out the dose.  Was appreciative, but stated had wanted a pain med like what she had been taking, eg percocet, etc.  Was in bed and didn't go to group, but came out on the hall afterward and got a snack, went back to bed,  Will continue to monitor for safety.

## 2012-02-12 NOTE — Progress Notes (Signed)
  Melinda Wood is a 56 y.o. female [id number] [birth year]  Subjective/Objective: Patient seen her chart reviewed.  She was given Lyrica she was complaining about pain.  She has a history of shoulder surgery. patient appears sedated otherwise do not offer any new complaint.  There has been no management issue in the unit.  She's not aggressive .  She continued to endorse chronic depression .  She remains paranoid about her neighbors however her thinking is improved from the past.  Patient is casually dressed and fairly groomed.  She maintained fair eye contact and appears to be sedated due to pain medication.  Her speech is soft but clear.  She continued to endorse paranoid thinking about her neighbors.  She denies any auditory or visual hallucination.  Her attention and concentration is fair.  Her insight and judgment remains impaired.  Her impulse control is okay.  No changes in her AIMS   Filed Vitals:   02/12/12 0701  BP: 122/76  Pulse: 81  Temp:   Resp:     Lab Results:   BMET    Component Value Date/Time   NA 136 02/09/2012 1558   K 5.2* 02/09/2012 1558   CL 100 02/09/2012 1558   CO2 30 02/09/2012 1558   GLUCOSE 83 02/09/2012 1558   BUN 22 02/09/2012 1558   CREATININE 1.00 02/09/2012 1558   CALCIUM 9.8 02/09/2012 1558   GFRNONAA 61* 02/09/2012 1558   GFRAA 71* 02/09/2012 1558    Medications:  Scheduled:     . atorvastatin  20 mg Oral QHS  . calcium-vitamin D  1 tablet Oral BH-qamhs  . divalproex  750 mg Oral QHS  . feeding supplement  237 mL Oral TID BM  . fesoterodine  4 mg Oral Daily  . Fluticasone-Salmeterol  1 puff Inhalation BID  . irbesartan  300 mg Oral Daily  . meloxicam  7.5 mg Oral Daily  . naphazoline  1 drop Both Eyes Daily  . nicotine      . OLANZapine  5 mg Oral QHS  . PARoxetine  40 mg Oral Daily  . pregabalin  100 mg Oral TID  . traZODone  100 mg Oral QHS  . vitamin B-12  1,000 mcg Oral Daily  . vitamin C  500 mg Oral Daily  . DISCONTD: pregabalin  50  mg Oral [id number]     PRN Meds acetaminophen, alum & mag hydroxide-simeth, LORazepam, magnesium hydroxide  Assessment Schizoaffective disorder bipolar type  Plan Patient appears to be sedated which could be due to to Lyrica along with other psychiatric medication. I will reduce Lyrica dosage to avoid further sedation.  She will continue all her other psychiatric medication.  She will be seen daily to evaluate her symptoms in progress.  She was encouraged to participate in group therapy.

## 2012-02-12 NOTE — Progress Notes (Signed)
Spoke with pt 1:1 who continues to report chronic shoulder pain and is requesting toradol. She is also requesting ativan for her anxiety. Pt remains quite tremulous and has difficulty even with bringing water cup to mouth. Pt states her pain is too great for her to tolerate group. Explained to pt her current meds for pain and did give her ativan as requested. Provided emotional support. She currently denies SI/HI/AVH. She remains tangential at times with limited insight into her current status. Memory continues to be an issue as well. Pt pleasant, cooperative and remains safe.Lawrence Marseilles

## 2012-02-12 NOTE — Progress Notes (Signed)
Patient ID: Melinda Wood, female   DOB: [birth year], 56 y.o.   MRN: [id number]   [facility] Group Notes:  (Counselor/Nursing/MHT/Case Management/Adjunct)  02/12/2012 11 AM  Type of Therapy:  Aftercare Planning, Group Therapy, Dance/Movement Therapy   Participation Level:  Did Not Attend   Modes of Intervention:  Clarification, Problem-solving, Role-play, Socialization and Support  Summary of Progress/Problems:  Pt did not attend aftercare planning/counseling group.  Debarah Crape 02/12/2012. 11:44 AM

## 2012-02-13 NOTE — Progress Notes (Signed)
Patient ID: Melinda Wood, female   DOB: 07/12/1955, 56 y.o.   MRN: [id number]  [facility] Group Notes:  (Counselor/Nursing/MHT/Case Management/Adjunct)  02/13/2012 11 AM  Type of Therapy:  Aftercare Planning, Group Therapy, Dance/Movement Therapy   Participation Level:  Active  Participation Quality:  Appropriate and Drowsy  Affect:  Anxious and Flat  Cognitive:  Delusional  Insight:  Limited  Engagement in Group:  Good  Engagement in Therapy:  Good  Modes of Intervention:  Clarification, Problem-solving, Role-play, Socialization and Support  Summary of Progress/Problems: Pt. attended and participated in aftercare planning group and counseling group on healthy support systems. Pt. accepted information on suicide prevention, warning signs to look for with suicide and crisis line numbers to use. The pt. agreed to call crisis line numbers if having warning signs or having thoughts of suicide.  Pt participated in a movement activity to demonstrate group cohesion and support using rhythm and keeping a steady beat.   Debarah Crape 02/13/2012. 11:20 AM

## 2012-02-13 NOTE — Progress Notes (Signed)
D   Pt continues to be med seeking   She did not attend spirituality group this morning but did attend movement therapy  She is tremoulous  Her interactions with others are limited   She isolates to her room and stays in the bed A   Verbal support given  Medications administered and effectiveness monitored  Q 15 min checks R   Pt safe at present

## 2012-02-13 NOTE — Progress Notes (Signed)
  Melinda Wood is a 56 y.o. female [id number] Jun 04, 1955  Subjective/Objective: Patient seen her chart reviewed.  Yesterday her lyrica was reduced as patient appears sedated.  She is feeling better however she endorse pain. She has a history of shoulder surgery. patient appears sedated otherwise do not offer any new complaint.  There has been no management issue in the unit.  She's not aggressive .  She continued to endorse paranoia and depression.  She continued to have delusions about her neighbors .  However she feel medicines is working.  She denies any side effects including any tremors or shakes.    Patient is casually dressed and fairly groomed.  She maintained fair eye contact.  She is more alert today. Her speech is soft but clear.  She continued to endorse paranoid thinking about her neighbors.  She denies any auditory or visual hallucination.  Her attention and concentration is fair.  Her insight and judgment remains impaired.  Her impulse control is okay.  No changes in her AIMS   Filed Vitals:   02/13/12 0701  BP: 114/75  Pulse: 89  Temp:   Resp:     Lab Results:   BMET    Component Value Date/Time   NA 136 02/09/2012 1558   K 5.2* 02/09/2012 1558   CL 100 02/09/2012 1558   CO2 30 02/09/2012 1558   GLUCOSE 83 02/09/2012 1558   BUN 22 02/09/2012 1558   CREATININE 1.00 02/09/2012 1558   CALCIUM 9.8 02/09/2012 1558   GFRNONAA 61* 02/09/2012 1558   GFRAA 71* 02/09/2012 1558    Medications:  Scheduled:     . atorvastatin  20 mg Oral QHS  . calcium-vitamin D  1 tablet Oral BH-qamhs  . divalproex  750 mg Oral QHS  . feeding supplement  237 mL Oral TID BM  . fesoterodine  4 mg Oral Daily  . Fluticasone-Salmeterol  1 puff Inhalation BID  . irbesartan  300 mg Oral Daily  . meloxicam  7.5 mg Oral Daily  . naphazoline  1 drop Both Eyes Daily  . OLANZapine  5 mg Oral QHS  . PARoxetine  40 mg Oral Daily  . pregabalin  50 mg Oral TID  . traZODone  100 mg Oral QHS  . vitamin  B-12  1,000 mcg Oral Daily  . vitamin C  500 mg Oral Daily  . DISCONTD: pregabalin  100 mg Oral TID     PRN Meds acetaminophen, alum & mag hydroxide-simeth, LORazepam, magnesium hydroxide  Assessment Schizoaffective disorder bipolar type  Plan  we will continue her current psychiatric medication.  We will encourage her to participate in group medical therapy.  She will seen daily to evaluate her symptoms and progress in her treatment.

## 2012-02-13 NOTE — Progress Notes (Signed)
Psychoeducational Group Note  Date:  02/13/2012 Time:  2000  Group Topic/Focus:  Goals Group:   The focus of this group is to help patients establish daily goals to achieve during treatment and discuss how the patient can incorporate goal setting into their daily lives to aide in recovery.  Participation Level:  Did Not Attend  Participation Quality:    Affect:    Cognitive:    Insight:    Engagement in Group:    Additional Comments:  none  Kaydi Kley M 02/13/2012, 11:36 PM

## 2012-02-13 NOTE — Progress Notes (Signed)
Pt remains anxious at this time. Continues to have significant tremors. Reports improved thinking, denies any AVH. However shows some preoccupation and paranoia still evident. Continues to state pain level in lower back is an 8. Pt given support. Medicated per orders. Pt given scheduled lyrica at this time. Refuses available prn pain options. Pt denies any SI/HI and remains safe. Melinda Wood

## 2012-02-13 NOTE — Progress Notes (Signed)
Psychoeducational Group Note  Date:  02/13/2012 Time:  0945 am  Group Topic/Focus:  Making Healthy Choices:   The focus of this group is to help patients identify negative/unhealthy choices they were using prior to admission and identify positive/healthier coping strategies to replace them upon discharge.  Participation Level:  Did Not Attend    Melinda Wood J 02/13/2012, 10:29 AM  

## 2012-02-14 LAB — CBC WITH DIFFERENTIAL/PLATELET
Eosinophils Absolute: 0.2 10*3/uL (ref 0.0–0.7)
Eosinophils Relative: 3 % (ref 0–5)
HCT: 38.1 % (ref 36.0–46.0)
Hemoglobin: 12 g/dL (ref 12.0–15.0)
Lymphocytes Relative: 29 % (ref 12–46)
Lymphs Abs: 2.7 10*3/uL (ref 0.7–4.0)
MCH: 31.8 pg (ref 26.0–34.0)
MCV: 101.1 fL — ABNORMAL HIGH (ref 78.0–100.0)
Monocytes Absolute: 0.7 10*3/uL (ref 0.1–1.0)
Monocytes Relative: 7 % (ref 3–12)
Platelets: 148 10*3/uL — ABNORMAL LOW (ref 150–400)
RBC: 3.77 MIL/uL — ABNORMAL LOW (ref 3.87–5.11)

## 2012-02-14 LAB — COMPREHENSIVE METABOLIC PANEL
AST: 33 U/L (ref 0–37)
Albumin: 3.5 g/dL (ref 3.5–5.2)
Chloride: 102 mEq/L (ref 96–112)
Creatinine, Ser: 0.79 mg/dL (ref 0.50–1.10)
Total Bilirubin: 0.2 mg/dL — ABNORMAL LOW (ref 0.3–1.2)
Total Protein: 6.3 g/dL (ref 6.0–8.3)

## 2012-02-14 MED ORDER — LORAZEPAM 1 MG PO TABS
1.0000 mg | ORAL_TABLET | Freq: Four times a day (QID) | ORAL | Status: DC | PRN
  Administered 2012-02-14 – 2012-02-16 (×6): 1 mg via ORAL
  Filled 2012-02-14 (×6): qty 1

## 2012-02-14 MED ORDER — OLANZAPINE 10 MG PO TABS
10.0000 mg | ORAL_TABLET | Freq: Every day | ORAL | Status: DC
  Administered 2012-02-14 – 2012-02-15 (×2): 10 mg via ORAL
  Filled 2012-02-14 (×3): qty 1
  Filled 2012-02-14: qty 14

## 2012-02-14 NOTE — Progress Notes (Signed)
Patient ID: Melinda Wood, female   DOB: 07-Dec-1955, 56 y.o.   MRN: [id number] D-Patient reports she slept well and that her appetite is good.  She reports her energy level is low and her ability to pay attention is improving.  She rates her anxiety a 0, saying the ativan really helps her not feel anxious.  She does have persistent L shoulder pain and she has a noticeable tremor.  She rates her depression a 6 and says she thinks the paxil is helping.  A- Supported pt and encouraged her to attend groups.  R- pt says she is feeling better and plans to go to all groups.

## 2012-02-14 NOTE — Progress Notes (Signed)
02/14/2012         Time: 0930      Group Topic/Focus: The focus of this group is on discussing various aspects of wellness, balancing those aspects and exploring ways to increase the ability to experience wellness.  Participation Level: Minimal  Participation Quality: Redirectable  Affect: Blunted  Cognitive: Oriented   Additional Comments: Patient participating minimally, but brightens and engages with encouragement from RT. Patient reports she enjoys engaging in activities at the [facility] and swimming.   Kassius Battiste 02/14/2012 11:58 AM

## 2012-02-14 NOTE — Progress Notes (Signed)
[facility] MD Progress Note  02/14/2012 2:39 PM  Diagnosis:  Axis I: Schizoaffective Disorder, bipolar type "I dont think Im schizophrenic at all." "There is a man upstairs running wires an he can detect me and know my moves. I have to wait til dark to get into the bath. He watches me."  ADL's:  Intact "My living environment is what takes me down. Here I feel good."  Sleep: Good "sleeping good with medication."  Appetite:  Good "eating too much" Mood: Pt denies depression, states I had been crying. "depression rated 0/10." Hopelessness- 0/10 Anxiety- 2/10 Denies A/V hallucinations    Suicidal Ideation:  Plan:  Denies " I dont feel like that now.." Intent:  denies Means:  none  Homicidal Ideation:  Plan:  denies Intent:  denies Means:  none  AEB (as evidenced by):  Mental Status Examination/Evaluation: Objective:  Appearance: Fairly Groomed  Patent attorney::  Fair  Speech:  Clear and Coherent  Volume:  Decreased  Mood:  Depressed  Affect:  Flat  Thought Process:  Loose  Orientation:  Full  Thought Content:  Delusions and Paranoid Ideation  Suicidal Thoughts:  No  Homicidal Thoughts:  No  Memory:  Immediate;   Good  Judgement:  Poor  Insight:  Lacking  Psychomotor Activity:  Tremor  Concentration:  Fair  Recall:  Fair  Akathisia:  No  Handed:    AIMS (if indicated):     Assets:  Housing  Sleep:  Number of Hours: 6.5    Vital Signs:Blood pressure 118/79, pulse 91, temperature 97.8 F (36.6 C), temperature source Oral, resp. rate 16.   Current Medications: Current Facility-Administered Medications  Medication Dose Route Frequency Provider Last Rate Last Dose  . acetaminophen (TYLENOL) tablet 650 mg  650 mg Oral Q6H PRN Jorje Guild, PA-C   650 mg at 02/11/12 2035  . alum & mag hydroxide-simeth (MAALOX/MYLANTA) 200-200-20 MG/5ML suspension 30 mL  30 mL Oral Q4H PRN Jorje Guild, PA-C      . atorvastatin (LIPITOR) tablet 20 mg  20 mg Oral QHS Curlene Labrum Readling, MD   20 mg at  02/13/12 2116  . calcium-vitamin D (OSCAL WITH D) 500-200 MG-UNIT per tablet 1 tablet  1 tablet Oral BH-qamhs Ronny Bacon, MD   1 tablet at 02/14/12 [phone]  . divalproex (DEPAKOTE ER) 24 hr tablet 750 mg  750 mg Oral QHS Curlene Labrum Readling, MD   750 mg at 02/13/12 2116  . feeding supplement (ENSURE COMPLETE) liquid 237 mL  237 mL Oral TID BM Curlene Labrum Readling, MD   237 mL at 02/14/12 1406  . fesoterodine (TOVIAZ) tablet 4 mg  4 mg Oral Daily Curlene Labrum Readling, MD   4 mg at 02/14/12 0852  . Fluticasone-Salmeterol (ADVAIR) 250-50 MCG/DOSE inhaler 1 puff  1 puff Inhalation BID Ronny Bacon, MD   1 puff at 02/14/12 0851  . irbesartan (AVAPRO) tablet 300 mg  300 mg Oral Daily Curlene Labrum Readling, MD   300 mg at 02/14/12 0852  . LORazepam (ATIVAN) tablet 1 mg  1 mg Oral Q6H PRN Curlene Labrum Readling, MD      . magnesium hydroxide (MILK OF MAGNESIA) suspension 30 mL  30 mL Oral Daily PRN Jorje Guild, PA-C   30 mL at 02/14/12 0629  . meloxicam (MOBIC) tablet 7.5 mg  7.5 mg Oral Daily Curlene Labrum Readling, MD   7.5 mg at 02/14/12 0855  . naphazoline (NAPHCON) 0.1 % ophthalmic solution 1 drop  1 drop  Both Eyes Daily Ronny Bacon, MD   1 drop at 02/14/12 0851  . OLANZapine (ZYPREXA) tablet 10 mg  10 mg Oral QHS Curlene Labrum Readling, MD      . PARoxetine (PAXIL) tablet 40 mg  40 mg Oral Daily Curlene Labrum Readling, MD   40 mg at 02/14/12 0852  . pregabalin (LYRICA) capsule 50 mg  50 mg Oral TID Cleotis Nipper, MD   50 mg at 02/14/12 1405  . traZODone (DESYREL) tablet 100 mg  100 mg Oral QHS Curlene Labrum Readling, MD   100 mg at 02/13/12 2116  . vitamin B-12 (CYANOCOBALAMIN) tablet 1,000 mcg  1,000 mcg Oral Daily Curlene Labrum Readling, MD   1,000 mcg at 02/14/12 0852  . vitamin C (ASCORBIC ACID) tablet 500 mg  500 mg Oral Daily Curlene Labrum Readling, MD   500 mg at 02/14/12 0852  . DISCONTD: LORazepam (ATIVAN) tablet 2 mg  2 mg Oral Q6H PRN Jorje Guild, PA-C   2 mg at 02/14/12 0857  . DISCONTD: OLANZapine (ZYPREXA) tablet 5 mg  5 mg Oral QHS  Curlene Labrum Readling, MD   5 mg at 02/13/12 2116    Lab Results: No results found for this or any previous visit (from the past 48 hour(s)).  Physical Findings: AIMS: Facial and Oral Movements Muscles of Facial Expression: None, normal Lips and Perioral Area: None, normal Jaw: None, normal Tongue: None, normal Extremity Movements Upper (arms, wrists, hands, fingers): None, normal Lower (legs, knees, ankles, toes): None, normal,  Trunk Movements-none Neck, shoulders, hips: None, normal,  Overall Severity Severity of abnormal movements (highest score from questions above): None, normal Incapacitation due to abnormal movements: None, normal Patient's awareness of abnormal movements (rate only patient's report): No Awareness,  Dental Status Current problems with teeth and/or dentures?: No Does patient usually wear dentures?: No  CIWA:    COWS:     Treatment Plan Summary: Daily contact with patient to assess and evaluate symptoms and progress in treatment Medication management- Will continue medications without changes.  Plan:  Norval Gable FNP- [facility] 02/14/2012, 2:39 PM

## 2012-02-14 NOTE — Progress Notes (Signed)
D:  Melinda Wood has been up on the unit, interacting appropriately with staff and other patients.  She complained of some anxiety and requested Ativan.  She denies SI/HI/AVH at this time.   A:  Administered medications as ordered, provided emotional support.  Safety checks q 15 minutes. R:  Safety maintained on unit.

## 2012-02-15 DIAGNOSIS — E875 Hyperkalemia: Secondary | ICD-10-CM

## 2012-02-15 DIAGNOSIS — I10 Essential (primary) hypertension: Secondary | ICD-10-CM

## 2012-02-15 MED ORDER — DIVALPROEX SODIUM ER 500 MG PO TB24
1000.0000 mg | ORAL_TABLET | Freq: Every day | ORAL | Status: DC
  Administered 2012-02-15: 1000 mg via ORAL
  Filled 2012-02-15 (×2): qty 2
  Filled 2012-02-15: qty 28

## 2012-02-15 MED ORDER — AMLODIPINE BESYLATE 5 MG PO TABS
5.0000 mg | ORAL_TABLET | Freq: Every day | ORAL | Status: DC
  Administered 2012-02-16: 5 mg via ORAL
  Filled 2012-02-15: qty 7
  Filled 2012-02-15 (×3): qty 1

## 2012-02-15 MED ORDER — AMLODIPINE BESYLATE 5 MG PO TABS: 5.0000 mg | ORAL_TABLET | Freq: Every day | ORAL | Status: DC

## 2012-02-15 MED ORDER — SODIUM POLYSTYRENE SULFONATE 15 GM/60ML PO SUSP
15.0000 g | Freq: Once | ORAL | Status: AC
  Administered 2012-02-15: 15 g via ORAL
  Filled 2012-02-15: qty 60

## 2012-02-15 NOTE — Progress Notes (Signed)
[facility] Medical Consultation  Melinda Wood ION:[id number] DOB: [birth year] DOA: 02/10/2012 PCP: Isabella Stalling, MD   Requesting physician: Franchot Gallo, MD Date of consultation: 02/15/2012 Reason for consultation: hyperkalemia  Impression/Recommendations 1. Mild hyperkalemia--likely ARB-induced. Suggest discontinue Avapro, start Norvasc, kayexylate x1, repeat BMP in AM. 2. HTN--well-controlled. Stop Avapro as above. Start Norvasc.  Brendia Sacks, MD [facility] Team 6 Pager [phone]  If 8PM-8AM, please contact night-coverage [email or web address] Password [email or web address]  Chief Complaint: hyperkalemia  HPI:  56 year old woman hospitalized at [facility] for schizophrenia. Noted to have hyperkalemia 5.2 9/18 and 5.4 9/23. I was asked to assist with management.  Patient is not on potassium replacement. She denies complaints. Her blood pressure is well controlled.   Review of Systems:  Negative for fever, visual changes, sore throat, rash, new muscle aches, chest pain, SOB, dysuria, bleeding, n/v/abdominal pain.  Past Medical History  Diagnosis Date  . High cholesterol   . Asthma   . Hypertension   . Bipolar 1 disorder   . Urinary incontinence   . Anxiety   . Panic attacks    Past Surgical History  Procedure Date  . Shoulder surgery   . Appendectomy    Social History:  reports that she has been smoking Cigarettes.  She has a 5 pack-year smoking history. She has never used smokeless tobacco. She reports that she does not drink alcohol or use illicit drugs.  Allergies  Allergen Reactions  . Arthrotec (Diclofenac-Misoprostol) Hives  . Sulfonamide Derivatives Hives   History reviewed. No pertinent family history.  Prior to Admission medications   Medication Sig Start Date End Date Taking? Authorizing Provider  diazepam (VALIUM) 5 MG tablet Take 5 mg by mouth 2 (two) times daily.   Yes Historical Provider, MD  divalproex (DEPAKOTE ER) 250 MG 24 hr tablet Take 250 mg by  mouth at bedtime.     Yes Historical Provider, MD  divalproex (DEPAKOTE ER) 500 MG 24 hr tablet Take 500 mg by mouth at bedtime.     Yes Historical Provider, MD  fesoterodine (TOVIAZ) 8 MG TB24 Take 8 mg by mouth daily.   Yes Historical Provider, MD  Fluticasone-Salmeterol (ADVAIR) 250-50 MCG/DOSE AEPB Inhale 1 puff into the lungs every 12 (twelve) hours.     Yes Historical Provider, MD  hydrOXYzine (VISTARIL) 50 MG capsule Take 50 mg by mouth at bedtime.   Yes Historical Provider, MD  methylphenidate (RITALIN) 20 MG tablet Take 60 mg by mouth daily.   Yes Historical Provider, MD  olmesartan (BENICAR) 40 MG tablet Take 40 mg by mouth daily.     Yes Historical Provider, MD  oxyCODONE-acetaminophen (PERCOCET/ROXICET) 5-325 MG per tablet Take 1 tablet by mouth every 8 (eight) hours as needed. pain   Yes Historical Provider, MD  PARoxetine (PAXIL) 30 MG tablet Take 30 mg by mouth every morning.   Yes Historical Provider, MD  pregabalin (LYRICA) 50 MG capsule Take 50 mg by mouth at bedtime.     Yes Historical Provider, MD  Risedronate Sodium (ATELVIA) 35 MG TBEC Take 1 tablet by mouth every Tuesday.    Yes Historical Provider, MD  rosuvastatin (CRESTOR) 10 MG tablet Take 10 mg by mouth daily.     Yes Historical Provider, MD  tetrahydrozoline (EYE DROPS) 0.05 % ophthalmic solution Place 1 drop into both eyes daily.   Yes Historical Provider, MD  traZODone (DESYREL) 100 MG tablet Take 200 mg by mouth at bedtime.    Yes Historical Provider, MD  varenicline (CHANTIX PAK)  0.5 MG X 11 & 1 MG X 42 tablet Take by mouth 2 (two) times daily. Take one 0.5 mg tablet by mouth once daily for 3 days, then increase to one 0.5 mg tablet twice daily for 4 days, then increase to one 1 mg tablet twice daily.   Yes Historical Provider, MD  vitamin B-12 (CYANOCOBALAMIN) 1000 MCG tablet Take 1,000 mcg by mouth daily.     Yes Historical Provider, MD  vitamin C (ASCORBIC ACID) 500 MG tablet Take 500 mg by mouth at bedtime.    Yes  Historical Provider, MD  calcium-vitamin D (OSCAL WITH D) 500-200 MG-UNIT per tablet Take 1 tablet by mouth 2 (two) times daily.      Historical Provider, MD   Physical Exam: Filed Vitals:   02/14/12 0839 02/14/12 0840 02/15/12 0600 02/15/12 0601  BP: 101/66 118/79 104/76 126/82  Pulse: 75 91 79 97  Temp: 97.8 F (36.6 C)  98.3 F (36.8 C)   TempSrc: Oral     Resp: 16  14     General:  Appears calm and comfortable Eyes: appear grossly normal ENT: grossly normal hearing, lips & tongue Cardiovascular: RRR, no m/r/g. No LE edema. Respiratory: CTA bilaterally, no w/r/r. Normal respiratory effort. Abdomen: soft, ntnd Musculoskeletal: grossly normal tone BUE/BLE  Labs on Admission:  Basic Metabolic Panel:  Lab 02/14/12 4540 02/09/12 1558  NA 140 136  K 5.4* 5.2*  CL 102 100  CO2 30 30  GLUCOSE 105* 83  BUN 22 22  CREATININE 0.79 1.00  CALCIUM 9.2 9.8  MG -- --  PHOS -- --   Liver Function Tests:  Lab 02/14/12 2012  AST 33  ALT 29  ALKPHOS 58  BILITOT 0.2*  PROT 6.3  ALBUMIN 3.5   CBC:  Lab 02/14/12 2012 02/09/12 1558  WBC 9.3 10.2  NEUTROABS 5.5 7.2  HGB 12.0 13.4  HCT 38.1 40.6  MCV 101.1* 97.4  PLT 148* 119*    Principal Problem:  *Schizoaffective disorder, bipolar type    Time spent: 45 minutes  Brendia Sacks, MD [facility] Team 6 Pager [phone]  If 8PM-8AM, please contact night-coverage [email or web address] Password [facility] 02/15/2012, 5:04 PM

## 2012-02-15 NOTE — Progress Notes (Signed)
[facility] Group Notes:  (Counselor/Nursing/MHT/Case Management/Adjunct)  02/15/2012 2:15 PM  Type of Therapy:  Group Therapy  Participation Level:  Did Not Attend     Melinda Wood 02/15/2012, 2:15 PM

## 2012-02-15 NOTE — Progress Notes (Signed)
Psychoeducational Group Note  Date:  02/15/2012 Time:  2200  Group Topic/Focus:  Wrap-Up Group:   The focus of this group is to help patients review their daily goal of treatment and discuss progress on daily workbooks.  Participation Level: Did Not Attend  Participation Quality:  Not Applicable  Affect:  Not Applicable  Cognitive:  Not Applicable  Insight:  Not Applicable  Engagement in Group: Not Applicable  Additional Comments:  Pt did not attend the evening group.  Christ Kick 02/15/2012, 4:50 AM

## 2012-02-15 NOTE — Progress Notes (Signed)
[facility] Group Notes:  (Counselor/Nursing/MHT/Case Management/Adjunct)  02/15/2012 2:12 PM  Type of Therapy:  Group Therapy  02/14/12  Participation Level:  Active  Participation Quality:  Attentive and Sharing  Affect:  Depressed  Cognitive:  Oriented  Insight:  Limited  Engagement in Group:  Good  Engagement in Therapy:  Good  Modes of Intervention:  Clarification, Education, Problem-solving and Support  Summary of Progress/Problems:  Patient talked about her chronic pain for the last 2 years since falling over her trash. She talked about her reconstructive surgery and how she spends a lot of time at home and on the bed. She continued to be positive despite this and talked about trying to keep positive thoughts. She was asking questions about her diagnosis.   Minor Iden, Aram Beecham 02/15/2012, 2:12 PM

## 2012-02-15 NOTE — Progress Notes (Deleted)
Patient ID: Melinda Wood, female   DOB: [birth year], 56 y.o.   MRN: [id number]   D: Patient lying in bed with eyes closed. Respirations even and non-labored. Appears to be sleeping. A: Staff will monitor on q 15 minute checks. RN to follow all treatments and give meds as ordered. R: No response due to sleep at this time.

## 2012-02-15 NOTE — Progress Notes (Signed)
Patient ID: Melinda Wood, female   DOB: March 06, 1956, 56 y.o.   MRN: [id number] Patient reports poor sleep and good appetite.  Her energy level is low and her ability to pay attention is poor.  She continues to have some tremors and she rates her anxiety an 8.  She says she is feeling more ready to go home. She has an appt Thursday that she would like to keep.   She says after discharge she plans to take her meds and ignore negative thinking from other people.

## 2012-02-15 NOTE — Progress Notes (Signed)
Patient ID: Melinda Wood, female   DOB: 09/28/1955, 56 y.o.   MRN: [id number]   D: Patient up earlier asking for something to drink. Asking if she could have something else for sleep but unable to give at this time due to getting trazodone and prn ativan earlier. Patient encouraged to try to sleep as much as possible and we would make note of how much she slept tonight.  A: Staff will monitor on q 15 minute checks and encourage group attendance. RN to follow treatments and medications as ordered. R: Patient went back to room to lay down at this time.

## 2012-02-15 NOTE — Progress Notes (Signed)
[facility] Case Management Discharge Plan:  Will you be returning to the same living situation after discharge: Yes,  home At discharge, do you have transportation home?:Yes,  family Do you have the ability to pay for your medications:Yes,  insurance  Interagency Information:     Release of information consent forms completed and in the chart;  Patient's signature needed at discharge.  Patient to Follow up at:  Follow-up Information    Follow up with [facility] . On 02/17/2012. (11:15 with Dr Elba Barman)    Contact information:   [address]  [place]  [phone]         Patient denies SI/HI:   Yes,  yes    Safety Planning and Suicide Prevention discussed:  Yes,  yes  Barrier to discharge identified:No.  Summary and Recommendations:   Melinda Wood 02/15/2012, 1:49 PM

## 2012-02-15 NOTE — Treatment Plan (Signed)
Interdisciplinary Treatment Plan Update (Adult)  Date: 02/15/2012  Time Reviewed: 10:21 AM   Progress in Treatment: Attending groups: Yes Participating in groups: Yes Taking medication as prescribed: Yes Tolerating medication: Yes   Family/Significant other contact made:   Patient understands diagnosis:  Yes Discussing patient identified problems/goals with staff:  Yes Medical problems stabilized or resolved:  No  Will ask for internal medicine consult due to high potassium Denies suicidal/homicidal ideation: Yes  In tx team Issues/concerns per patient self-inventory:  Yes  Rates depression as a 6, and pain at an 8. Other:  New problem(s) identified: N/A  Reason for Continuation of Hospitalization: Medical Issues Medication stabilization  Interventions implemented related to continuation of hospitalization:  Internal medicine consult,  Encourage group attendance and participation  Additional comments:  Estimated length of stay: D/C tomorrow  Discharge Plan: return home, follow up outpt  New goal(s): N/A  Review of initial/current patient goals per problem list:   1.  Goal(s): Reduce depressive symptoms  Met:  Yes  Target date:9/24   As evidenced by :Pamelia Hoit reports her depression is a 0 as a result of finding out she is leaving tomorrow  2.  Goal (s): Eliminate SI  Met:  Yes  Target date:924  As evidenced ZO:XWRU report in tx team  3.  Goal(s):Reduce psychosis  Met:  Yes  Target date:9/24  As evidenced by: self report in tx team  Chamille reports that she will move from her home if she continues to have problems with the neighbors  4.  Goal(s):  Met:  Yes  Target date:  As evidenced by:  Attendees: Patient:  Melinda Wood 02/15/2012 10:21 AM  Family:     Physician:  Harvie Heck Readling 02/15/2012 10:21 AM   Nursing: Joslyn Devon   02/15/2012 10:21 AM   Case Manager:  Richelle Ito,  02/15/2012 10:21 AM   Counselor:  Veto Kemps 02/15/2012 10:21 AM   Other:      Other:     Other:     Other:      Scribe for Treatment Team:   Ida Rogue, 02/15/2012 10:21 AM

## 2012-02-15 NOTE — Progress Notes (Signed)
[facility] MD Progress Note  02/15/2012 4:13 PM  Current Mental Status Per Physician:  Diagnosis:  Axis I: Schizoaffective Disorder - Bipolar Type.   The patient was seen today and reports the following:   ADL's: Intact.  Sleep: The patient reports to sleeping well last night. Appetite: The patient reports that her appetite is good.   Mild>(1-10) >Severe  Hopelessness (1-10): 0  Depression (1-10): 0  Anxiety (1-10): 7   Suicidal Ideation: The patient denies any suicidal ideations today.  Plan: No  Intent: No  Means: No   Homicidal Ideation: The patient denies any homicidal ideations today.  Plan: No  Intent: No.  Means: No   General Appearance/Behavior: The patient was friendly and cooperative today with this provider.  Eye Contact: Good.  Speech: Appropriate in rate and volume with no pressuring of speech noted today.  Motor Behavior: wnl.  Level of Consciousness: Alert and Oriented x 3.  Mental Status: Alert and Oriented x 3.  Mood: Essentially Euthymic today.  Affect: Appears bright and full.  Anxiety Level: Moderate anxiety reported today.  Thought Process: The patient denies any auditory or visual hallucinations. She reports some mild paranoid delusions which are much improved. Thought Content: The patient denies any auditory or visual hallucinations. She reports some mild paranoid delusions which are much improved. Perception: The patient denies any auditory or visual hallucinations. She reports some mild paranoid delusions which are much improved.  Judgment: Fair to Good.  Insight: Fair to Good.  Cognition: Oriented to person, place and time.  Sleep:  Number of Hours: 4.25    Vital Signs:Blood pressure 126/82, pulse 97, temperature 98.3 F (36.8 C), temperature source Oral, resp. rate 14.  Current Medications: Current Facility-Administered Medications  Medication Dose Route Frequency Provider Last Rate Last Dose  . acetaminophen (TYLENOL) tablet 650 mg  650 mg Oral  Q6H PRN Jorje Guild, PA-C   650 mg at 02/11/12 2035  . alum & mag hydroxide-simeth (MAALOX/MYLANTA) 200-200-20 MG/5ML suspension 30 mL  30 mL Oral Q4H PRN Jorje Guild, PA-C      . atorvastatin (LIPITOR) tablet 20 mg  20 mg Oral QHS Curlene Labrum Readling, MD   20 mg at 02/14/12 2100  . calcium-vitamin D (OSCAL WITH D) 500-200 MG-UNIT per tablet 1 tablet  1 tablet Oral BH-qamhs Ronny Bacon, MD   1 tablet at 02/15/12 0818  . divalproex (DEPAKOTE ER) 24 hr tablet 1,000 mg  1,000 mg Oral QHS Curlene Labrum Readling, MD      . feeding supplement (ENSURE COMPLETE) liquid 237 mL  237 mL Oral TID BM Curlene Labrum Readling, MD   237 mL at 02/15/12 1358  . fesoterodine (TOVIAZ) tablet 4 mg  4 mg Oral Daily Curlene Labrum Readling, MD   4 mg at 02/15/12 0817  . Fluticasone-Salmeterol (ADVAIR) 250-50 MCG/DOSE inhaler 1 puff  1 puff Inhalation BID Ronny Bacon, MD   1 puff at 02/15/12 0817  . LORazepam (ATIVAN) tablet 1 mg  1 mg Oral Q6H PRN Curlene Labrum Readling, MD   1 mg at 02/15/12 1444  . magnesium hydroxide (MILK OF MAGNESIA) suspension 30 mL  30 mL Oral Daily PRN Jorje Guild, PA-C   30 mL at 02/14/12 0629  . meloxicam (MOBIC) tablet 7.5 mg  7.5 mg Oral Daily Curlene Labrum Readling, MD   7.5 mg at 02/15/12 0817  . naphazoline (NAPHCON) 0.1 % ophthalmic solution 1 drop  1 drop Both Eyes Daily Ronny Bacon, MD   1  drop at 02/15/12 0817  . OLANZapine (ZYPREXA) tablet 10 mg  10 mg Oral QHS Curlene Labrum Readling, MD   10 mg at 02/14/12 2100  . PARoxetine (PAXIL) tablet 40 mg  40 mg Oral Daily Curlene Labrum Readling, MD   40 mg at 02/15/12 0817  . pregabalin (LYRICA) capsule 50 mg  50 mg Oral TID Cleotis Nipper, MD   50 mg at 02/15/12 1358  . sodium polystyrene (KAYEXALATE) 15 GM/60ML suspension 15 g  15 g Oral Once Standley Brooking, MD   15 g at 02/15/12 1159  . traZODone (DESYREL) tablet 100 mg  100 mg Oral QHS Curlene Labrum Readling, MD   100 mg at 02/14/12 2100  . vitamin B-12 (CYANOCOBALAMIN) tablet 1,000 mcg  1,000 mcg Oral Daily Curlene Labrum Readling, MD    1,000 mcg at 02/15/12 0816  . vitamin C (ASCORBIC ACID) tablet 500 mg  500 mg Oral Daily Curlene Labrum Readling, MD   500 mg at 02/15/12 0817  . DISCONTD: divalproex (DEPAKOTE ER) 24 hr tablet 750 mg  750 mg Oral QHS Curlene Labrum Readling, MD   750 mg at 02/14/12 2100  . DISCONTD: irbesartan (AVAPRO) tablet 300 mg  300 mg Oral Daily Curlene Labrum Readling, MD   300 mg at 02/15/12 7829   Lab Results:  Results for orders placed during the hospital encounter of 02/10/12 (from the past 48 hour(s))  COMPREHENSIVE METABOLIC PANEL     Status: Abnormal   Collection Time   02/14/12  8:12 PM      Component Value Range Comment   Sodium 140  135 - 145 mEq/L    Potassium 5.4 (*) 3.5 - 5.1 mEq/L    Chloride 102  96 - 112 mEq/L    CO2 30  19 - 32 mEq/L    Glucose, Bld 105 (*) 70 - 99 mg/dL    BUN 22  6 - 23 mg/dL    Creatinine, Ser 5.62  0.50 - 1.10 mg/dL    Calcium 9.2  8.4 - 13.0 mg/dL    Total Protein 6.3  6.0 - 8.3 g/dL    Albumin 3.5  3.5 - 5.2 g/dL    AST 33  0 - 37 U/L    ALT 29  0 - 35 U/L    Alkaline Phosphatase 58  39 - 117 U/L    Total Bilirubin 0.2 (*) 0.3 - 1.2 mg/dL    GFR calc non Af Amer >90  >90 mL/min    GFR calc Af Amer >90  >90 mL/min   CBC WITH DIFFERENTIAL     Status: Abnormal   Collection Time   02/14/12  8:12 PM      Component Value Range Comment   WBC 9.3  4.0 - 10.5 K/uL    RBC 3.77 (*) 3.87 - 5.11 MIL/uL    Hemoglobin 12.0  12.0 - 15.0 g/dL    HCT 86.5  78.4 - 69.6 %    MCV 101.1 (*) 78.0 - 100.0 fL    MCH 31.8  26.0 - 34.0 pg    MCHC 31.5  30.0 - 36.0 g/dL    RDW 29.5  28.4 - 13.2 %    Platelets 148 (*) 150 - 400 K/uL    Neutrophils Relative 60  43 - 77 %    Neutro Abs 5.5  1.7 - 7.7 K/uL    Lymphocytes Relative 29  12 - 46 %    Lymphs Abs 2.7  0.7 -  4.0 K/uL    Monocytes Relative 7  3 - 12 %    Monocytes Absolute 0.7  0.1 - 1.0 K/uL    Eosinophils Relative 3  0 - 5 %    Eosinophils Absolute 0.2  0.0 - 0.7 K/uL    Basophils Relative 2 (*) 0 - 1 %    Basophils Absolute 0.2  (*) 0.0 - 0.1 K/uL   VALPROIC ACID LEVEL     Status: Abnormal   Collection Time   02/14/12  8:12 PM      Component Value Range Comment   Valproic Acid Lvl 42.3 (*) 50.0 - 100.0 ug/mL    Physical Findings: AIMS: Facial and Oral Movements Muscles of Facial Expression: None, normal Lips and Perioral Area: None, normal Jaw: None, normal Tongue: None, normal,Extremity Movements Upper (arms, wrists, hands, fingers): None, normal Lower (legs, knees, ankles, toes): None, normal, Trunk Movements Neck, shoulders, hips: None, normal, Overall Severity Severity of abnormal movements (highest score from questions above): None, normal Incapacitation due to abnormal movements: None, normal Patient's awareness of abnormal movements (rate only patient's report): No Awareness, Dental Status Current problems with teeth and/or dentures?: No Does patient usually wear dentures?: No   Review of Systems:  Neurological: No headaches, seizures or dizziness reported.  G.I.: The patient denies any constipation or stomach upset today.  Musculoskeletal: The patient reports chronic left should pain related to a past surgical repair.   Time was spent today discussing with the patient her current symptoms. The patient reports to sleeping well with her current medications and reports a good appetite.  She denies any  significant feelings of sadness, anhedonia or depressed mood today. The patient denies any suicidal or homicidal ideations today.  She denies any auditory or visual hallucinations and reports much improvement in her paranoid delusions. She denies any medication related side effects today and feels she is ready for discharge.   Treatment Plan Summary:  1. Daily contact with patient to assess and evaluate symptoms and progress in treatment.  2. Medication management  3. The patient will deny suicidal ideations or homicidal ideations for 48 hours prior to discharge and have a depression and anxiety rating of 3  or less. The patient will also deny any auditory or visual hallucinations or delusional thinking.  4. The patient will deny any symptoms of substance withdrawal at time of discharge.   Plan:  1. Will continue the medication Paxil at 40 mgs po q am for depression and anxiety.  2. Will increase the medication Depakote ER to 1000 mgs po qhs for mood stabilization.  3. Will continue the patient on the medication Zyprexa at 5 mgs po qhs to address her delusional thinking and to assist with sleep and appetite.  4. Will continue the medication Trazodone at 100 mgs po qhs for sleep.  5. Will continue the patient on Mobic 7.5 mgs po q am for chronic shoulder pain.  6. Will continue the patient on her non-psychiatric medications to address her medical issues.  7. Laboratory Studies reviewed.  8. Will continue to monitor.    RANDY READLING 02/15/2012, 4:13 PM

## 2012-02-16 LAB — BASIC METABOLIC PANEL
BUN: 26 mg/dL — ABNORMAL HIGH (ref 6–23)
CO2: 31 mEq/L (ref 19–32)
Calcium: 9 mg/dL (ref 8.4–10.5)
Chloride: 101 mEq/L (ref 96–112)
Creatinine, Ser: 0.84 mg/dL (ref 0.50–1.10)
Glucose, Bld: 81 mg/dL (ref 70–99)

## 2012-02-16 MED ORDER — MELOXICAM 7.5 MG PO TABS: 7.5000 mg | ORAL_TABLET | Freq: Every day | ORAL | Status: DC

## 2012-02-16 MED ORDER — CALCIUM CARBONATE-VITAMIN D 500-200 MG-UNIT PO TABS: 1.0000 | ORAL_TABLET | ORAL | Status: AC

## 2012-02-16 MED ORDER — FLUTICASONE-SALMETEROL 250-50 MCG/DOSE IN AEPB: 1.0000 | INHALATION_SPRAY | Freq: Two times a day (BID) | RESPIRATORY_TRACT | Status: DC

## 2012-02-16 MED ORDER — ENSURE COMPLETE PO LIQD: 237.0000 mL | Freq: Three times a day (TID) | ORAL | Status: DC

## 2012-02-16 MED ORDER — PAROXETINE HCL 40 MG PO TABS: 40.0000 mg | ORAL_TABLET | Freq: Every day | ORAL | Status: AC

## 2012-02-16 MED ORDER — ASCORBIC ACID 500 MG PO TABS: 500.0000 mg | ORAL_TABLET | Freq: Every day | ORAL | Status: AC

## 2012-02-16 MED ORDER — DIVALPROEX SODIUM ER 500 MG PO TB24: 1000.0000 mg | ORAL_TABLET | Freq: Every day | ORAL | Status: AC

## 2012-02-16 MED ORDER — LORAZEPAM 0.5 MG PO TABS
0.5000 mg | ORAL_TABLET | Freq: Three times a day (TID) | ORAL | Status: DC | PRN
  Filled 2012-02-16: qty 9

## 2012-02-16 MED ORDER — CYANOCOBALAMIN 1000 MCG PO TABS: 1000.0000 ug | ORAL_TABLET | Freq: Every day | ORAL | Status: AC

## 2012-02-16 MED ORDER — AMLODIPINE BESYLATE 5 MG PO TABS: 5.0000 mg | ORAL_TABLET | Freq: Every day | ORAL | Status: AC

## 2012-02-16 MED ORDER — FESOTERODINE FUMARATE ER 4 MG PO TB24: 4.0000 mg | ORAL_TABLET | Freq: Every day | ORAL | Status: AC

## 2012-02-16 MED ORDER — NAPHAZOLINE HCL 0.1 % OP SOLN: 1.0000 [drp] | Freq: Every day | OPHTHALMIC | Status: DC

## 2012-02-16 MED ORDER — LORAZEPAM 1 MG PO TABS: 0.5000 mg | ORAL_TABLET | Freq: Three times a day (TID) | ORAL | Status: DC | PRN

## 2012-02-16 MED ORDER — OLANZAPINE 10 MG PO TABS: 10.0000 mg | ORAL_TABLET | Freq: Every day | ORAL | Status: AC

## 2012-02-16 MED ORDER — PREGABALIN 50 MG PO CAPS: 50.0000 mg | ORAL_CAPSULE | ORAL | Status: DC

## 2012-02-16 MED ORDER — TRAZODONE HCL 100 MG PO TABS: 100.0000 mg | ORAL_TABLET | Freq: Every day | ORAL | Status: AC

## 2012-02-16 MED ORDER — ATORVASTATIN CALCIUM 20 MG PO TABS: 20.0000 mg | ORAL_TABLET | Freq: Every day | ORAL | Status: AC

## 2012-02-16 NOTE — Progress Notes (Signed)
Pt observed in the dayroom watching TV and interacting with her peers.  Pt reports she is doing fine and says she will probably discharge home tomorrow.  She denies SI/HI/AV.  She is focused on getting an Ativan which it seems she is taking q6h.  Informed pt that it was too early to get another Ativan, but that she could get one at bedtime.  Pt voiced no other needs/complaints.  Safety maintained with q15 minute checks.

## 2012-02-16 NOTE — Progress Notes (Signed)
[facility] Case Management Discharge Plan:  Will you be returning to the same living situation after discharge: Yes,  home At discharge, do you have transportation home?:Yes,  family Do you have the ability to pay for your medications:Yes,  insurance  Interagency Information:     Release of information consent forms completed and in the chart;  Patient's signature needed at discharge.  Patient to Follow up at:  Follow-up Information    Follow up with [facility] . On 02/17/2012. (Come in after your appointment with Dr Janna Arch  Dr Elba Barman can see you between 12:30 and 2:30)    Contact information:   [address]  [place]  [phone]         Patient denies SI/HI:   Yes,  yes    Safety Planning and Suicide Prevention discussed:  Yes,  yes  Barrier to discharge identified:No.  Summary and Recommendations:   Melinda Wood 02/16/2012, 10:22 AM

## 2012-02-16 NOTE — Progress Notes (Signed)
02/16/2012         Time: 0930      Group Topic/Focus: The focus of the group is on enhancing the patients' ability to utilize positive relaxation strategies by practicing several that can be used at discharge.  Participation Level: Minimal  Participation Quality: Appropriate and Attentive  Affect: Blunted  Cognitive: Oriented   Additional Comments: Patient reports she is looking forward to discharge today and was able to identify things she would like to do differently upon discharge to help stay healthy. Patient left group early to be seen by treatment team and didn't return.    Murna Backer 02/16/2012 12:38 PM

## 2012-02-16 NOTE — Progress Notes (Signed)
[facility] Group Notes:  (Counselor/Nursing/MHT/Case Management/Adjunct)  02/16/2012 1:22 PM  Type of Therapy:  Psychoeducational Skills  Participation Level:  Did Not Attend    D/C     Melinda Wood 02/16/2012, 1:22 PM

## 2012-02-16 NOTE — Progress Notes (Signed)
[facility] PROGRESS NOTE  Melinda Wood ZOX:[id number] DOB: 1955-12-24 DOA: 02/10/2012 PCP: Isabella Stalling, MD    Brief narrative: 56 year old woman hospitalized at [facility] for schizophrenia. Noted to have hyperkalemia 5.2 9/18 and 5.4 9/23. I was asked to assist with management.  Patient is not on potassium replacement. She denies complaints. Her blood pressure is well controlled.  Assessment/   *Schizoaffective disorder, bipolar type  Hyperkalemia  HTN (hypertension)  Recommendations:  1. Mild hyperkalemia--likely ARB-induced. dced ARB and started  Norvasc, kayexylate x1, repeat k this am stable. BP stable 2. HTN--well-controlled. Continue norvasc. dced avapro      medical consult will sign off. Please call for any questions.       HPI/Subjective: Denies any symptoms  Objective: Filed Vitals:   02/14/12 0840 02/15/12 0600 02/15/12 0601 02/16/12 0613  BP: 118/79 104/76 126/82 112/78  Pulse: 91 79 97 77  Temp:  98.3 F (36.8 C)  97.6 F (36.4 C)  TempSrc:    Oral  Resp:  14  18   No intake or output data in the 24 hours ending 02/16/12 1414 There were no vitals filed for this visit.  Exam:  General: NAD Heent: No pallor, moist mucosa Cardiovascular: RRR, no m/r/g. No LE edema.  Respiratory: CTA bilaterally, no w/r/r. Normal respiratory effort.  Abdomen: soft, ntnd  Musculoskeletal: warm CNS: AAOX3   Data Reviewed: Basic Metabolic Panel:  Lab 02/16/12 8119 02/14/12 2012 02/09/12 1558  NA 138 140 136  K 4.5 5.4* 5.2*  CL 101 102 100  CO2 31 30 30   GLUCOSE 81 105* 83  BUN 26* 22 22  CREATININE 0.84 0.79 1.00  CALCIUM 9.0 9.2 9.8  MG -- -- --  PHOS -- -- --   Liver Function Tests:  Lab 02/14/12 2012  AST 33  ALT 29  ALKPHOS 58  BILITOT 0.2*  PROT 6.3  ALBUMIN 3.5   No results found for this basename: LIPASE:5,AMYLASE:5 in the last 168 hours No results found for this basename: AMMONIA:5 in the last 168 hours CBC:  Lab 02/14/12  2012 02/09/12 1558  WBC 9.3 10.2  NEUTROABS 5.5 7.2  HGB 12.0 13.4  HCT 38.1 40.6  MCV 101.1* 97.4  PLT 148* 119*   Cardiac Enzymes: No results found for this basename: CKTOTAL:5,CKMB:5,CKMBINDEX:5,TROPONINI:5 in the last 168 hours BNP (last 3 results) No results found for this basename: PROBNP:3 in the last 8760 hours CBG: No results found for this basename: GLUCAP:5 in the last 168 hours  No results found for this or any previous visit (from the past 240 hour(s)).   Studies: No results found.  Scheduled Meds:   . amLODipine  5 mg Oral Daily  . atorvastatin  20 mg Oral QHS  . calcium-vitamin D  1 tablet Oral BH-qamhs  . divalproex  1,000 mg Oral QHS  . feeding supplement  237 mL Oral TID BM  . fesoterodine  4 mg Oral Daily  . Fluticasone-Salmeterol  1 puff Inhalation BID  . meloxicam  7.5 mg Oral Daily  . naphazoline  1 drop Both Eyes Daily  . OLANZapine  10 mg Oral QHS  . PARoxetine  40 mg Oral Daily  . pregabalin  50 mg Oral TID  . traZODone  100 mg Oral QHS  . vitamin B-12  1,000 mcg Oral Daily  . vitamin C  500 mg Oral Daily  . DISCONTD: amLODipine  5 mg Oral Daily   Continuous Infusions:     Time spent: 25  minutes    Eddie North  [facility] Pager [phone] If 8PM-8AM, please contact night-coverage at [email or web address], password [facility] 02/16/2012, 2:14 PM  LOS: 6 days

## 2012-02-16 NOTE — Progress Notes (Addendum)
Discharge Note:  Pt discharged to home and was picked up by her son in law.  RN reviewed discharge medications and follow up appointments with her with voiced understanding given.  She denies SI/HI and A/V hallucinations.  She was also given samples of her medication as well as prescriptions.  She obtained all belongings out of her locker prior to leaving.

## 2012-02-16 NOTE — [facility] Suicide Risk Assessment (Signed)
Suicide Risk Assessment  Discharge Assessment     Demographic Factors:  Caucasian, Low socioeconomic status and Unemployed  Mental Status Per Nursing Assessment::   On Admission:   (denis [facility]) At Time of Discharge:  Time was spent today discussing with the patient her current symptoms. The patient reports to sleeping well at night with her current medications and reports a good appetite. She denies any significant feelings of sadness, anhedonia or depressed mood today. The patient adamantly denies any suicidal or homicidal ideations today. She denies any auditory or visual hallucinations and reports much improvement in her paranoid delusions. She denies any anxiety symptoms as well as any medication related side effects today and feels she is ready for discharge.   The patient will be discharged today as requested to outpatient follow up.  Current Mental Status Per Physician:  Diagnosis:  Axis I: Schizoaffective Disorder - Bipolar Type.   The patient was seen today and reports the following:   ADL's: Intact.  Sleep: The patient reports to sleeping well last night.  Appetite: The patient reports that her appetite is good.   Mild>(1-10) >Severe  Hopelessness (1-10): 0  Depression (1-10): 0  Anxiety (1-10): 0   Suicidal Ideation: The patient adamantly denies any suicidal ideations today.  Plan: No  Intent: No  Means: No   Homicidal Ideation: The patient adamantly denies any homicidal ideations today.  Plan: No  Intent: No.  Means: No   General Appearance/Behavior: The patient was friendly and cooperative today with this provider.  Eye Contact: Good.  Speech: Appropriate in rate and volume with no pressuring of speech noted today.  Motor Behavior: wnl.  Level of Consciousness: Alert and Oriented x 3.  Mental Status: Alert and Oriented x 3.  Mood: Essentially Euthymic today.  Affect: Appears bright and full.  Anxiety Level: No anxiety reported today.  Thought Process: The  patient denies any auditory or visual hallucinations. She reports some very mild paranoid delusions which are much improved.  Thought Content: The patient denies any auditory or visual hallucinations. She reports some very mild paranoid delusions which are much improved.  Perception: The patient denies any auditory or visual hallucinations. She reports some very mild paranoid delusions which are much improved.  Judgment: Fair to Good.  Insight: Fair to Good.  Cognition: Oriented to person, place and time.   Loss Factors: Decline in physical health and Financial problems/change in socioeconomic status  Historical Factors: Long history of mental illness.  History of non-psychiatric medical issues.    Risk Reduction Factors:   Good family support.  Good access to healthcare.    Continued Clinical Symptoms:  Previous Psychiatric Diagnoses and Treatments Medical Diagnoses and Treatments/Surgeries Schizoaffective Disorder - Bipolar Type.  Discharge Diagnoses:   AXIS I:   Schizoaffective Disorder - Bipolar Type.  AXIS II:   Deferred. AXIS III:   1.  High Cholesterol.   2.  Asthma.   3.  Hypertension.   4.  Urinary Incontinence. AXIS IV:   Chronic Mental Illness.  Non-psychiatric medical issues.  Unemployment. AXIS V:   GAF at time of admission approximately 30.  GAF at time of discharge approximately 50.  Cognitive Features That Contribute To Risk:  None Noted.   Current Medications:  Current Facility-Administered Medications   Medication  Dose  Route  Frequency  Provider  Last Rate  Last Dose   .  acetaminophen (TYLENOL) tablet 650 mg  650 mg  Oral  Q6H PRN  Jorje Guild, PA-C  650 mg at 02/11/12 2035   .  alum & mag hydroxide-simeth (MAALOX/MYLANTA) 200-200-20 MG/5ML suspension 30 mL  30 mL  Oral  Q4H PRN  Jorje Guild, PA-C     .  atorvastatin (LIPITOR) tablet 20 mg  20 mg  Oral  QHS  Curlene Labrum Eithan Beagle, MD   20 mg at 02/14/12 2100   .  calcium-vitamin D (OSCAL WITH D) 500-200 MG-UNIT  per tablet 1 tablet  1 tablet  Oral  BH-qamhs  Ronny Bacon, MD   1 tablet at 02/15/12 0818   .  divalproex (DEPAKOTE ER) 24 hr tablet 1,000 mg  1,000 mg  Oral  QHS  Curlene Labrum Caitrin Pendergraph, MD     .  feeding supplement (ENSURE COMPLETE) liquid 237 mL  237 mL  Oral  TID BM  Curlene Labrum Janifer Gieselman, MD   237 mL at 02/15/12 1358   .  fesoterodine (TOVIAZ) tablet 4 mg  4 mg  Oral  Daily  Curlene Labrum Darus Hershman, MD   4 mg at 02/15/12 0817   .  Fluticasone-Salmeterol (ADVAIR) 250-50 MCG/DOSE inhaler 1 puff  1 puff  Inhalation  BID  Ronny Bacon, MD   1 puff at 02/15/12 0817   .  LORazepam (ATIVAN) tablet 1 mg  1 mg  Oral  Q6H PRN  Curlene Labrum Therese Rocco, MD   1 mg at 02/15/12 1444   .  magnesium hydroxide (MILK OF MAGNESIA) suspension 30 mL  30 mL  Oral  Daily PRN  Jorje Guild, PA-C   30 mL at 02/14/12 0629   .  meloxicam (MOBIC) tablet 7.5 mg  7.5 mg  Oral  Daily  Curlene Labrum Novaleigh Kohlman, MD   7.5 mg at 02/15/12 0817   .  naphazoline (NAPHCON) 0.1 % ophthalmic solution 1 drop  1 drop  Both Eyes  Daily  Curlene Labrum Vernelle Wisner, MD   1 drop at 02/15/12 0817   .  OLANZapine (ZYPREXA) tablet 10 mg  10 mg  Oral  QHS  Curlene Labrum Shooter Tangen, MD   10 mg at 02/14/12 2100   .  PARoxetine (PAXIL) tablet 40 mg  40 mg  Oral  Daily  Curlene Labrum Nakeisha Greenhouse, MD   40 mg at 02/15/12 0817   .  pregabalin (LYRICA) capsule 50 mg  50 mg  Oral  TID  Cleotis Nipper, MD   50 mg at 02/15/12 1358   .  sodium polystyrene (KAYEXALATE) 15 GM/60ML suspension 15 g  15 g  Oral  Once  Standley Brooking, MD   15 g at 02/15/12 1159   .  traZODone (DESYREL) tablet 100 mg  100 mg  Oral  QHS  Curlene Labrum Meyer Dockery, MD   100 mg at 02/14/12 2100   .  vitamin B-12 (CYANOCOBALAMIN) tablet 1,000 mcg  1,000 mcg  Oral  Daily  Curlene Labrum Jhaniya Briski, MD   1,000 mcg at 02/15/12 0816   .  vitamin C (ASCORBIC ACID) tablet 500 mg  500 mg  Oral  Daily  Curlene Labrum Delynn Pursley, MD   500 mg at 02/15/12 0817   .  DISCONTD: divalproex (DEPAKOTE ER) 24 hr tablet 750 mg  750 mg  Oral  QHS  Curlene Labrum Maylie Ashton, MD   750 mg at  02/14/12 2100   .  DISCONTD: irbesartan (AVAPRO) tablet 300 mg  300 mg  Oral  Daily  Curlene Labrum Ardella Chhim, MD   300 mg at 02/15/12 1610    Lab Results:  Results for orders placed during the hospital encounter of 02/10/12 (from the past 48 hour(s))   COMPREHENSIVE METABOLIC PANEL Status: Abnormal    Collection Time    02/14/12 8:12 PM   Component  Value  Range  Comment    Sodium  140  135 - 145 mEq/L     Potassium  5.4 (*)  3.5 - 5.1 mEq/L     Chloride  102  96 - 112 mEq/L     CO2  30  19 - 32 mEq/L     Glucose, Bld  105 (*)  70 - 99 mg/dL     BUN  22  6 - 23 mg/dL     Creatinine, Ser  4.78  0.50 - 1.10 mg/dL     Calcium  9.2  8.4 - 10.5 mg/dL     Total Protein  6.3  6.0 - 8.3 g/dL     Albumin  3.5  3.5 - 5.2 g/dL     AST  33  0 - 37 U/L     ALT  29  0 - 35 U/L     Alkaline Phosphatase  58  39 - 117 U/L     Total Bilirubin  0.2 (*)  0.3 - 1.2 mg/dL     GFR calc non Af Amer  >90  >90 mL/min     GFR calc Af Amer  >90  >90 mL/min    CBC WITH DIFFERENTIAL Status: Abnormal    Collection Time    02/14/12 8:12 PM   Component  Value  Range  Comment    WBC  9.3  4.0 - 10.5 K/uL     RBC  3.77 (*)  3.87 - 5.11 MIL/uL     Hemoglobin  12.0  12.0 - 15.0 g/dL     HCT  29.5  62.1 - 30.8 %     MCV  101.1 (*)  78.0 - 100.0 fL     MCH  31.8  26.0 - 34.0 pg     MCHC  31.5  30.0 - 36.0 g/dL     RDW  65.7  84.6 - 96.2 %     Platelets  148 (*)  150 - 400 K/uL     Neutrophils Relative  60  43 - 77 %     Neutro Abs  5.5  1.7 - 7.7 K/uL     Lymphocytes Relative  29  12 - 46 %     Lymphs Abs  2.7  0.7 - 4.0 K/uL     Monocytes Relative  7  3 - 12 %     Monocytes Absolute  0.7  0.1 - 1.0 K/uL     Eosinophils Relative  3  0 - 5 %     Eosinophils Absolute  0.2  0.0 - 0.7 K/uL     Basophils Relative  2 (*)  0 - 1 %     Basophils Absolute  0.2 (*)  0.0 - 0.1 K/uL    VALPROIC ACID LEVEL Status: Abnormal    Collection Time    02/14/12 8:12 PM   Component  Value  Range  Comment    Valproic Acid Lvl  42.3 (*)   50.0 - 100.0 ug/mL     Physical Findings:  AIMS: Facial and Oral Movements  Muscles of Facial Expression: None, normal  Lips and Perioral Area: None, normal  Jaw: None, normal  Tongue: None, normal,Extremity Movements  Upper (arms, wrists, hands, fingers): None, normal  Lower (legs, knees, ankles, toes): None, normal, Trunk Movements  Neck, shoulders, hips: None, normal, Overall Severity  Severity of abnormal movements (highest score from questions above): None, normal  Incapacitation due to abnormal movements: None, normal  Patient's awareness of abnormal movements (rate only patient's report): No Awareness, Dental Status  Current problems with teeth and/or dentures?: No  Does patient usually wear dentures?: No   Review of Systems:  Neurological: No headaches, seizures or dizziness reported.  G.I.: The patient denies any constipation or stomach upset today.  Musculoskeletal: The patient reports chronic left should pain related to a past surgical repair.   Time was spent today discussing with the patient her current symptoms. The patient reports to sleeping well at night with her current medications and reports a good appetite. She denies any significant feelings of sadness, anhedonia or depressed mood today. The patient adamantly denies any suicidal or homicidal ideations today. She denies any auditory or visual hallucinations and reports much improvement in her paranoid delusions. She denies any anxiety symptoms as well as any medication related side effects today and feels she is ready for discharge.   The patient will be discharged today as requested to outpatient follow up.  Treatment Plan Summary:  1. Daily contact with patient to assess and evaluate symptoms and progress in treatment.  2. Medication management  3. The patient will deny suicidal ideations or homicidal ideations for 48 hours prior to discharge and have a depression and anxiety rating of 3 or less. The patient will  also deny any auditory or visual hallucinations or delusional thinking.  4. The patient will deny any symptoms of substance withdrawal at time of discharge.   Plan:  1. Will continue the medication Paxil at 40 mgs po q am for depression and anxiety.  2. Will continue the medication Depakote ER at 1000 mgs po qhs for mood stabilization.  3. Will continue the patient on the medication Zyprexa at 5 mgs po qhs to address her delusional thinking and to assist with sleep and appetite.  4. Will continue the medication Trazodone at 100 mgs po qhs for sleep.  5. Will continue the patient on Mobic 7.5 mgs po q am for chronic shoulder pain.  6. Will continue the patient on her non-psychiatric medications to address her medical issues.  7. Laboratory Studies reviewed.  8. Will continue to monitor.  9. The patient will be discharge   Suicide Risk:  Minimal: No identifiable suicidal ideation.  Patients presenting with no risk factors but with morbid ruminations; may be classified as minimal risk based on the severity of the depressive symptoms  Plan Of Care/Follow-up recommendations:  Activity:  As tolerated. Diet:  Heart Healthy Diet Other:  Please take all medications only as directed and keep all scheduled follow up appointments.  Please return to your local Emergency Room should you have any thoughts of harming yourself or others.  Brailon Don 02/16/2012, 11:51 AM
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
## 2012-02-18 NOTE — Progress Notes (Signed)
Patient Discharge Instructions:  After Visit Summary (AVS):   Faxed to:  02/18/2012 Psychiatric Admission Assessment Note:   Faxed to:  02/18/2012 Suicide Risk Assessment - Discharge Assessment:   Faxed to:  02/18/2012 Faxed/Sent to the Next Level Care provider:  02/18/2012   Faxed to [facility] @ [phone]  Wandra Scot, 02/18/2012, 2:14 PM

## 2013-08-18 ENCOUNTER — Emergency Department (HOSPITAL_COMMUNITY)
Admission: EM | Admit: 2013-08-18 | Discharge: 2013-08-18 | Disposition: A | Discharge status: Home / Self Care | Payer: PRIVATE HEALTH INSURANCE | Attending: Emergency Medicine | Admitting: Emergency Medicine

## 2013-08-18 ENCOUNTER — Encounter (HOSPITAL_COMMUNITY): Payer: Self-pay | Admitting: Emergency Medicine

## 2013-08-18 ENCOUNTER — Emergency Department (HOSPITAL_COMMUNITY): Payer: PRIVATE HEALTH INSURANCE

## 2013-08-18 DIAGNOSIS — G839 Paralytic syndrome, unspecified: Secondary | ICD-10-CM | POA: Insufficient documentation

## 2013-08-18 DIAGNOSIS — F319 Bipolar disorder, unspecified: Secondary | ICD-10-CM | POA: Insufficient documentation

## 2013-08-18 DIAGNOSIS — Z79899 Other long term (current) drug therapy: Secondary | ICD-10-CM | POA: Insufficient documentation

## 2013-08-18 DIAGNOSIS — Z87891 Personal history of nicotine dependence: Secondary | ICD-10-CM | POA: Insufficient documentation

## 2013-08-18 DIAGNOSIS — J45909 Unspecified asthma, uncomplicated: Secondary | ICD-10-CM | POA: Insufficient documentation

## 2013-08-18 DIAGNOSIS — Z87448 Personal history of other diseases of urinary system: Secondary | ICD-10-CM | POA: Insufficient documentation

## 2013-08-18 DIAGNOSIS — E7800 Pure hypercholesterolemia, unspecified: Secondary | ICD-10-CM | POA: Insufficient documentation

## 2013-08-18 DIAGNOSIS — Z8673 Personal history of transient ischemic attack (TIA), and cerebral infarction without residual deficits: Secondary | ICD-10-CM | POA: Insufficient documentation

## 2013-08-18 DIAGNOSIS — F411 Generalized anxiety disorder: Secondary | ICD-10-CM | POA: Insufficient documentation

## 2013-08-18 DIAGNOSIS — G5630 Lesion of radial nerve, unspecified upper limb: Secondary | ICD-10-CM

## 2013-08-18 DIAGNOSIS — I10 Essential (primary) hypertension: Secondary | ICD-10-CM | POA: Insufficient documentation

## 2013-08-18 MED ORDER — OXYCODONE-ACETAMINOPHEN 5-325 MG PO TABS: 1.0000 | ORAL_TABLET | ORAL | Status: DC | PRN

## 2013-08-18 NOTE — ED Notes (Signed)
Pt reports pain and numbness to right wrist and right hand x 2 days.  States hit hand on table x 2 days ago.  Reports unable to lift hand at wrist.  ROM in right elbow and right shoulder wnl.  ROM limited to right wrist/hand due to numbness and pain.  Grip strength in right hand present but weak compared to left.  CMS intact, right radial pulse present and wnl.  No obvious bruising/abrasions/deformities noted.  Pt also c/o right rib cage pain radiating into right upper abd.  denies n/v/d.  Reports pain ONLY with movement, pain free when still.  abd soft, non-distended.  nad noted.

## 2013-08-18 NOTE — ED Notes (Signed)
Pt c/o right hand numbness, pain, tingling and right side abd pain, tingling that started yesterday, denies any injury, pt states that her right hand "droops" and she is not able to life it up to use it,

## 2013-08-18 NOTE — Discharge Instructions (Signed)
Radial Nerve Palsy °Wrist drop is also known as radial nerve palsy. It is a condition in which you can not extend your wrist. This means if you are standing with your elbow bent at a right angle and with the top of your hand pointed at the ceiling, you can not hold your hand up. It falls toward the floor.  °This action of extending your wrist is caused by the muscles in the back of your arm. These muscles are controlled by the radial nerve. This means that anything affecting the radial nerve so it can not tell the muscles how to work will cause wrist drop. This is medically called radial nerve palsy. Also the radial nerve is a motor and sensory nerve so anything affecting it causes problems with movement and feeling. °CAUSES  °Some more common causes of wrist drop are: °· A break (fracture) of the large bone in the arm between your shoulder and your elbow (humerus). This is because the radial nerve winds around the humerus. °· Improper use of crutches causes this because the radial nerve runs through the armpit (axilla). Crutches which are too long can put pressure on the nerve. This is sometimes called crutch palsy. °· Falling asleep with your arm over a chair and supported on the back is a common cause. This is sometimes called Saturday Night Syndrome. °· Wrist drop can be associated with lead poisoning because of the effect of lead on the radial nerve. °SYMPTOMS  °The wrist drop is an obvious problem, but there may also be numbness in the back of the arm, forearm or hand which provides feeling in these areas by the radial nerve. There can be difficulty straightening out the elbow in addition to the wrist. There may be numbness, tingling, pain, burning sensations or other abnormal feelings. Symptoms depend entirely on where the radial nerve is injured. °DIAGNOSIS  °· Wrist drop is obvious just by looking at it. Your caregiver may make the diagnosis by taking your history and doing a couple tests. °· One test which  may be done is a nerve conduction study. This test shows if the radial nerve is conducting signals well. If not, it can determine where the nerve problem is. °· Sometimes X-ray studies are done. Your caregiver will determine if further testing needs to be done. °TREATMENT  °· Usually if the problem is found to be pressure on the nerve, simply removing the pressure will allow the nerve to go back to normal in a few weeks to a few months. Other treatments will depend upon the cause found. °· Only take over-the-counter or prescription medicines for pain, discomfort, or fever as directed by your caregiver. °· Sometimes seizure medications are used. °· Steroids are sometimes given to decrease swelling if it is thought to be a possible cause. °Document Released: 01/14/2006 Document Revised: 08/02/2011 Document Reviewed: 02/24/2006 °[employer]® Patient Information ©2014 [employer]. ° °

## 2013-08-18 NOTE — ED Provider Notes (Signed)
CSN: [id number]     Arrival date & time 08/18/13  1034 History  This chart was scribed for Raeford Razor, MD by Quintella Reichert, ED scribe.  This patient was seen in room APA14/[facility]14 and the patient's care was started at 12:07 PM.   Chief Complaint  Patient presents with  . Numbness    The history is provided by the patient. No language interpreter was used.    HPI Comments: Melinda Wood is a 58 y.o. female who presents to the Emergency Department complaining of weakness to her right hand that began yesterday.  Pt states she bumped the back of her right hand on a side table and since then has been unable to extend the hand.  She also reports numbness and tingling to the top of her hand which radiates partway into her forearm.  She denies numbness or tingling in the palm or fingers.  Pt is right-handed.  She does not recall sleeping with her arm in any unusual positions recently.    Past Medical History  Diagnosis Date  . High cholesterol   . Asthma   . Hypertension   . Bipolar 1 disorder   . Urinary incontinence   . Anxiety   . Panic attacks     Past Surgical History  Procedure Laterality Date  . Shoulder surgery    . Appendectomy      No family history on file.   History  Substance Use Topics  . Smoking status: Former Smoker -- 0.50 packs/day for 10 years    Types: Cigarettes  . Smokeless tobacco: Never Used  . Alcohol Use: No    OB History   Grav Para Term Preterm Abortions TAB SAB Ect Mult Living   3 3 3       3        Review of Systems  All other systems reviewed and are negative.      Allergies  Arthrotec and Sulfonamide derivatives  Home Medications   Current Outpatient Rx  Name  Route  Sig  Dispense  Refill  . amLODipine (NORVASC) 5 MG tablet   Oral   Take 1 tablet (5 mg total) by mouth daily. For blood pressure control.   30 tablet   0   . ATELVIA 35 MG TBEC   Oral   Take 35 mg by mouth every 7 (seven) days.         Marland Kitchen  atorvastatin (LIPITOR) 20 MG tablet   Oral   Take 1 tablet (20 mg total) by mouth at bedtime. For cholesterol control.   30 tablet   0   . budesonide-formoterol (SYMBICORT) 160-4.5 MCG/ACT inhaler   Inhalation   Inhale 2 puffs into the lungs 2 (two) times daily.         . calcium-vitamin D (OSCAL WITH D) 500-200 MG-UNIT per tablet   Oral   Take 1 tablet by mouth 2 (two) times daily in the am and at bedtime.. For bone health.   60 tablet   0   . CHANTIX STARTING MONTH PAK 0.5 MG X 11 & 1 MG X 42 tablet   Oral   Take 0.5 mg by mouth daily.         . diazepam (VALIUM) 5 MG tablet   Oral   Take 5 mg by mouth 3 (three) times daily.         . divalproex (DEPAKOTE ER) 500 MG 24 hr tablet   Oral   Take 2 tablets (1,000  mg total) by mouth at bedtime. For mood stabilization.   60 tablet   0   . feeding supplement (ENSURE COMPLETE) LIQD   Oral   Take 237 mLs by mouth 3 (three) times daily between meals. For nutritional supplementation.   90 Bottle   0   . fesoterodine (TOVIAZ) 4 MG TB24   Oral   Take 1 tablet (4 mg total) by mouth daily. Urinary incontinence   30 tablet   0   . methylphenidate (RITALIN) 20 MG tablet   Oral   Take 20 mg by mouth 3 (three) times daily.         . naphazoline (NAPHCON) 0.1 % ophthalmic solution   Both Eyes   Place 1 drop into both eyes daily. For dry eyes.   15 mL   0   . OLANZapine (ZYPREXA) 10 MG tablet   Oral   Take 1 tablet (10 mg total) by mouth at bedtime. For mood stabilization, sleep and clarity of thoughts.   30 tablet   0   . oxyCODONE-acetaminophen (PERCOCET/ROXICET) 5-325 MG per tablet   Oral   Take 1 tablet by mouth 4 (four) times daily.         Marland Kitchen. PARoxetine (PAXIL) 40 MG tablet   Oral   Take 1 tablet (40 mg total) by mouth daily. For depression and anxiety.   30 tablet   0   . pregabalin (LYRICA) 50 MG capsule   Oral   Take 50 mg by mouth at bedtime. For chronic pain.         . traZODone (DESYREL) 100  MG tablet   Oral   Take 1 tablet (100 mg total) by mouth at bedtime. For sleep.   30 tablet   0   . vitamin B-12 1000 MCG tablet   Oral   Take 1 tablet (1,000 mcg total) by mouth daily. For nutritional supplementation.   30 tablet   0   . vitamin C (VITAMIN C) 500 MG tablet   Oral   Take 1 tablet (500 mg total) by mouth daily. For nutritional supplementation.   30 tablet   0    BP 121/84  Pulse 88  Temp(Src) 98.2 F (36.8 C) (Oral)  Resp 20  Ht 5\' 4"  (1.626 m)  Wt 152 lb (68.947 kg)  BMI 26.08 kg/m2  SpO2 95%  Physical Exam  Nursing note and vitals reviewed. Constitutional: She appears well-developed and well-nourished. No distress.  HENT:  Head: Normocephalic and atraumatic.  Eyes: Conjunctivae are normal. Right eye exhibits no discharge. Left eye exhibits no discharge.  Neck: Neck supple.  Cardiovascular: Normal rate, regular rhythm and normal heart sounds.  Exam reveals no gallop and no friction rub.   No murmur heard. Pulmonary/Chest: Effort normal and breath sounds normal. No respiratory distress.  Abdominal: Soft. She exhibits no distension. There is no tenderness.  Musculoskeletal: She exhibits no edema and no tenderness.  Neurological: She is alert.  Right radial nerve palsy.  Decreased sensation of the dorsal aspect of the right hand and distal forearm.  Right wrist drop.  No contraction of the brachioradialis with arm flexion.  Median and ulnar nerve functions intact.  Good radial pulse.  Skin: Skin is warm and dry.  Psychiatric: She has a normal mood and affect. Her behavior is normal. Thought content normal.    ED Course  Procedures (including critical care time)  DIAGNOSTIC STUDIES: Oxygen Saturation is 95% on room air, adequate by my interpretation.  COORDINATION OF CARE: 12:14 PM-Informed pt that symptoms are likely due to radial nerve palsy.  Discussed treatment plan which includes steroids with pt at bedside and pt agreed to plan.     Labs  Review Labs Reviewed - No data to display  Imaging Review Dg Wrist Complete Right  08/18/2013   CLINICAL DATA:  Injury to the right wrist.  EXAM: RIGHT WRIST - COMPLETE 3+ VIEW  COMPARISON:  None.  FINDINGS: Negative for a fracture or dislocation. No significant soft tissue swelling. The carpal bones are intact.  IMPRESSION: No acute bone abnormality.   Electronically Signed   By: Richarda Overlie M.D.   On: 08/18/2013 11:53     EKG Interpretation None      MDM   Final diagnoses:  Radial nerve palsy    58yF with radial nerve palsy. She relates to hitting her hand? Describes pretty innocuous trauma though. No contraction of brachioradialis though which would place lesion higher than area she relates hitting. Deficits clearly in radial nerve distribution and isolated. Likely little utility in imaging or other w/u. Plan symptomatic tx. OUupt FU.     I personally preformed the services scribed in my presence. The recorded information has been reviewed is accurate. Raeford Razor, MD.    Raeford Razor, MD 08/23/13 [phone]

## 2013-09-05 ENCOUNTER — Ambulatory Visit (HOSPITAL_COMMUNITY)
Admission: RE | Admit: 2013-09-05 | Discharge: 2013-09-05 | Disposition: A | Discharge status: Home / Self Care | Payer: PRIVATE HEALTH INSURANCE | Source: Ambulatory Visit | Attending: Neurology | Admitting: Neurology

## 2013-09-05 DIAGNOSIS — IMO0001 Reserved for inherently not codable concepts without codable children: Secondary | ICD-10-CM | POA: Insufficient documentation

## 2013-09-05 DIAGNOSIS — M25639 Stiffness of unspecified wrist, not elsewhere classified: Secondary | ICD-10-CM | POA: Insufficient documentation

## 2013-09-05 DIAGNOSIS — M6281 Muscle weakness (generalized): Secondary | ICD-10-CM | POA: Insufficient documentation

## 2013-09-05 DIAGNOSIS — I10 Essential (primary) hypertension: Secondary | ICD-10-CM | POA: Insufficient documentation

## 2013-09-05 DIAGNOSIS — M21339 Wrist drop, unspecified wrist: Secondary | ICD-10-CM | POA: Insufficient documentation

## 2013-09-05 DIAGNOSIS — M25649 Stiffness of unspecified hand, not elsewhere classified: Secondary | ICD-10-CM

## 2013-09-05 DIAGNOSIS — M25549 Pain in joints of unspecified hand: Secondary | ICD-10-CM | POA: Insufficient documentation

## 2013-09-05 NOTE — Evaluation (Addendum)
Occupational Therapy Evaluation  Patient Details  Name: Melinda Wood MRN: [id number] Date of Birth: [birth year]  Today's Date: 09/05/2013 Time: 1006-1100 OT Time Calculation (min): 54 min Eval 1006-1045 (39') Self-Care/HEP Review 1045-1100 (15')  Visit#: 1 of 16  Re-eval: 10/03/13  Assessment Diagnosis: Right Wrist Drop Surgical Date:  (n/a) Next MD Visit: May 13th, June 9th Prior Therapy: for left shoulder  Authorization: [employer] (and [place]  Authorization Time Period:    Authorization Visit#: 1 of 16   Past Medical History:  Past Medical History  Diagnosis Date  . High cholesterol   . Asthma   . Hypertension   . Bipolar 1 disorder   . Urinary incontinence   . Anxiety   . Panic attacks    Past Surgical History:  Past Surgical History  Procedure Laterality Date  . Shoulder surgery    . Appendectomy      Subjective Symptoms/Limitations Symptoms: S: "All I can do is move these fingers." Pertinent History: Pt is 58 year old presenting to outpatient OT with right wrist drop.  Pt states she woke up on Thursday morning (4/9) unable to move her right hand and being unable to pick anything up. Pt reports no change in her motion since its onset.  Pt went to the ED for her conditions where they have her a black arm brace (from ahdn to elbw) and percoset for her pain. Pt has 8/10 burning, throbbing pain on dorsal side of hand MCPs to elbow. Limitations: Wrist and digit extension Special Tests: FOTO 30/100 (70% limited) Pain Assessment Currently in Pain?: Yes Pain Score: 8  Pain Location: Hand Pain Orientation: Right Pain Type: Acute pain;Neuropathic pain Pain Radiating Towards: towards elbow Pain Onset: In the past 7 days Pain Frequency: Constant  Precautions/Restrictions  Precautions Precautions: None Restrictions Weight Bearing Restrictions: No  Balance Screening Balance Screen Has the patient fallen in the past 6 months: No  Prior  Functioning  Home Living Family/patient expects to be discharged to:: Private residence Living Arrangements: Alone Available Help at Discharge: Home health;Neighbor Type of Home: Apartment Prior Function Level of Independence: Needs assistance with ADLs;Needs assistance with homemaking Driving: Yes Vocation: Unemployed Leisure: Hobbies-yes (Comment) (TV, dusting, reading, and piddling around the house) Comments: Pt has home health nurse to assist with bathing, dressing, cleaning, and laundry  Assessment ADL/Vision/Perception ADL ADL Comments: Pt is having difficulties picking things up overall. Pt specifically mentioned puttin on her seatbelt, cooking, and eating. pt reports "getting it all over me" and being unable to go to restaurants due to this. Dominant Hand: Right Vision - History Baseline Vision: Wears glasses for distance only  Cognition/Observation Cognition Overall Cognitive Status: Within Functional Limits for tasks assessed Arousal/Alertness: Awake/alert Orientation Level: Oriented X4  Sensation/Coordination/Edema Sensation Light Touch: Appears Intact Hot/Cold: Appears Intact Additional Comments: Pt reports numbness on dorsal hand to elbow.  pt has decreased sharp/dull discrimination in radial distribution and in elbow region. Coordination Gross Motor Movements are Fluid and Coordinated:  (Pt has history of bilateral shoulder deficits) Fine Motor Movements are Fluid and Coordinated: No  Additional Assessments RUE Assessment RUE Assessment: Exceptions to [facility] RUE AROM (degrees) RUE Overall AROM Comments: pt assess in seated.  Pt has history of shoulder AROM deficits. Right Elbow Flexion: 128 Right Elbow Extension: 183 Right Forearm Pronation:  (WFL) Right Forearm Supination:  (WFL) Right Wrist Extension:  (trace) Right Wrist Flexion: 70 Degrees Right Wrist Radial Deviation: 18 Degrees Right Wrist Ulnar Deviation: 26 Degrees Right Composite  Finger Extension:  25% (in gravity eliminated, Trace with gravity) Right Composite Finger Flexion:  (Full) Right Thumb Opposition: Digit 5 (weak, difficulty with coordination) RUE PROM (degrees) Right Wrist Extension: 42 Degrees RUE Strength RUE Overall Strength Comments: Assess in seated Right Elbow Flexion: 5/5 Right Elbow Extension: 5/5 Right Forearm Pronation: 5/5 Right Forearm Supination: 3+/5 Right Wrist Flexion: 5/5 Right Wrist Extension: 2-/5 Grip (lbs): 11 Lateral Pinch: 5 lbs 3 Point Pinch: 6 lbs LUE Strength Grip (lbs): 43 Lateral Pinch: 11 lbs 3 Point Pinch: 14 lbs Right Hand Strength - Pinch (lbs) Lateral Pinch: 5 lbs 3 Point Pinch: 6 lbs Left Hand Strength - Pinch (lbs) Lateral Pinch: 11 lbs 3 Point Pinch: 14 lbs   Occupational Therapy Assessment and Plan OT Assessment and Plan Clinical Impression Statement: Pt is presenting to outpatient OT with right wrist drop due to radial nerve plasy.  She has associated wrist and digit extension decreased ROM, overall hand weakness, decreased sensation, increased pain, and decreased ADL and IADL status as a result. Pt will benefit from skilled therapeutic intervention in order to improve on the following deficits: Impaired UE functional use;Decreased range of motion;Decreased strength;Impaired sensation Rehab Potential: Good OT Frequency: Min 2X/week OT Duration: 8 weeks OT Treatment/Interventions: Self-care/ADL training;Therapeutic exercise;Neuromuscular education;DME and/or AE instruction;Manual therapy;Modalities;Splinting;Therapeutic activities;Patient/family education OT Plan: Pt will benefit from skilled OT services to improve ROM and strength, decresed pain, and increase ADL and IADL status.       Treatment Plan:  Create and educate on radial nerve splint PRN, wrist and digit PROM, AAROM, and AROM, strengthening, fine motor coordination   Goals Home Exercise Program Pt/caregiver will Perform Home Exercise Program: For increased ROM;For  increased strengthening PT Goal: Perform Home Exercise Program - Progress: Goal set today Short Term Goals Time to Complete Short Term Goals: 4 weeks Short Term Goal 1: Pt will be educated on HEP Short Term Goal 2: Pt will be educated on wear and care of radial nerve splint and demonstrated abillity to don/doff as needed. Short Term Goal 3: Pt will improve right digit extension to 50% in gravity elminated position in working towards improved grasp and release. Short Term Goal 4: Pt will improve right grip strength by atleast 10 lbs for improved ability to cook. Short Term Goal 5: Pt will improve right wrist extension to [facility] range in gravity eliminated position. Additional Short Term Goals?: Yes Short Term Goal 6: Pt will have pain of less than 5/10 in right hand. Long Term Goals Time to Complete Long Term Goals: 8 weeks Long Term Goal 1: Pt will return to prior level of functioning in all ADL, IADL, and leisure tasks, Long Term Goal 2: Pt will improve right digit extension to WNL range for improved ability to use fork to eat. Long Term Goal 3: Pt will improve right grip strength by atleast 25 lbs for decreased drops while completing ADL tasks. Long Term Goal 4: Pt will improve right wrist extension to [facility] range in gravity plane for improved engagement in ADL tasks. Long Term Goal 5: Pt will verbalize no difficulties, 5/5 times, with donning seatbelt in her car. Additional Long Term Goals?: Yes Long Term Goal 6: Pt will have pain of less than 1/10 in her right hand.  Problem List Patient Active Problem List   Diagnosis Date Noted  . Muscle weakness (generalized) 09/05/2013  . Decreased range of motion of wrist 09/05/2013  . Decreased range of motion of finger 09/05/2013  . Pain in joint, hand  09/05/2013  . Hyperkalemia 02/15/2012  . HTN (hypertension) 02/15/2012  . Schizoaffective disorder, bipolar type 02/10/2012  . FRACTURE, HUMERUS, PROXIMAL 02/12/2008  . SUBLUXATION-RADIAL HEAD  02/12/2008    End of Session Activity Tolerance: Patient tolerated treatment well General Behavior During Therapy: WFL for tasks assessed/performed OT Plan of Care OT Home Exercise Plan: Wrist PROM and AROM, fine motor coordination OT Patient Instructions: handouts (scanned) - pt verbalized understanding Consulted and Agree with Plan of Care: Patient  GO  Current - 70% impaired  - CL Goal - 20% impaired - CJ  [name], MS, OTR/L [phone]  09/05/2013, 12:47 PM  Physician Documentation Your signature is required to indicate approval of the treatment plan as stated above.  Please sign and either send electronically or make a copy of this report for your files and return this physician signed original.  Please mark one 1.__approve of plan  2. ___approve of plan with the following conditions.   ______________________________                                                          _____________________ Physician Signature                                                                                                             Date

## 2013-09-11 ENCOUNTER — Ambulatory Visit (HOSPITAL_COMMUNITY)
Admission: RE | Admit: 2013-09-11 | Discharge: 2013-09-11 | Disposition: A | Discharge status: Home / Self Care | Payer: PRIVATE HEALTH INSURANCE | Source: Ambulatory Visit | Attending: Family Medicine | Admitting: Family Medicine

## 2013-09-11 DIAGNOSIS — IMO0001 Reserved for inherently not codable concepts without codable children: Secondary | ICD-10-CM | POA: Diagnosis not present

## 2013-09-11 NOTE — Progress Notes (Signed)
Occupational Therapy Treatment Patient Details  Name: Melinda Wood MRN: [id number] Date of Birth: 1955/12/30  Today's Date: 09/11/2013 Time: 1019-1100 OT Time Calculation (min): 41 min Manual 1019-1029 (10') Therapeutic Exercises 1029-1052 (23') Ice pack 1052-1100 (8')  Visit#: 2 of 16  Re-eval: 10/03/13    Authorization: [employer] [place]and [place]  Authorization Time Period:    Authorization Visit#: 2 of 16  Subjective Symptoms/Limitations Symptoms: "When I use the brace that helps. Is uncomfortable, And I can't pick up or hold nothering." Pain Assessment Currently in Pain?: Yes Pain Score: 7  Pain Location: Wrist Pain Orientation: Right Pain Type: Acute pain  Precautions/Restrictions     Exercise/Treatments Wrist Exercises Forearm Supination: PROM;10 reps;AROM Forearm Pronation: PROM;10 reps;AROM Wrist Flexion: PROM;AROM;10 reps Wrist Extension: PROM;AAROM;10 reps Wrist Radial Deviation: AROM;10 reps Wrist Ulnar Deviation: AROM;10 reps   Sponges: 6, 7 (tower - 4 with sponges, 5 with blocks)  Wrist Weighted Stretch    Fine Motor Coordination    Hand Exercises Digit Composite Abduction: AROM;10 reps Digit Composite Adduction: AROM;10 reps Sponges: 6, 7 (tower - 4 with sponges, 5 with blocks) Other Hand Exercises: Digit composite flexion and extension - PROM 10x Other Hand Exercises: Resisted clothepins (while wearing brace) - placed 7 red on middle horizontal bar with lateral pinch. Removed with 3 point pinch. Placed 7 green pins on ba, with one rest break. Pt becaem fatigued.     Modalities Modalities: Cryotherapy Manual Therapy Manual Therapy: Myofascial release Myofascial Release: MFR to right volar forearm in supination to decrase tightness and pain with supination Cryotherapy Number Minutes Cryotherapy: 8 Minutes Cryotherapy Location: Wrist;Hand;Forearm Type of Cryotherapy: Ice pack  Occupational Therapy Assessment and Plan OT  Assessment and Plan Clinical Impression Statement: Pt has sllight decrase in pain from previous session, and has slilghtly improved wrist extension from 1 week prior (approx 25% range in gravity eliminated position).  Pt tolerated well spone and clothespin exercises, but quicly fatigued. OT Plan: Make radial nerve splint as able.  Follow up with pt about recent fall and current balance - discuss PT referral.   Goals Short Term Goals Short Term Goal 1: Pt will be educated on HEP Short Term Goal 1 Progress: Progressing toward goal Short Term Goal 2: Pt will be educated on wear and care of radial nerve splint and demonstrated abillity to don/doff as needed. Short Term Goal 2 Progress: Progressing toward goal Short Term Goal 3: Pt will improve right digit extension to 50% in gravity elminated position in working towards improved grasp and release. Short Term Goal 3 Progress: Progressing toward goal Short Term Goal 4: Pt will improve right grip strength by atleast 10 lbs for improved ability to cook. Short Term Goal 4 Progress: Progressing toward goal Short Term Goal 5: Pt will improve right wrist extension to [facility] range in gravity eliminated position. Short Term Goal 5 Progress: Progressing toward goal Long Term Goals Long Term Goal 1: Pt will return to prior level of functioning in all ADL, IADL, and leisure tasks, Long Term Goal 1 Progress: Progressing toward goal Long Term Goal 2: Pt will improve right digit extension to WNL range for improved ability to use fork to eat. Long Term Goal 2 Progress: Progressing toward goal Long Term Goal 3: Pt will improve right grip strength by atleast 25 lbs for decreased drops while completing ADL tasks. Long Term Goal 3 Progress: Progressing toward goal Long Term Goal 4: Pt will improve right wrist extension to [facility] range in gravity  plane for improved engagement in ADL tasks. Long Term Goal 4 Progress: Progressing toward goal Long Term Goal 5: Pt will  verbalize no difficulties, 5/5 times, with donning seatbelt in her car. Long Term Goal 5 Progress: Progressing toward goal Additional Long Term Goals?: Yes Long Term Goal 6: Pt will have pain of less than 1/10 in her right hand. Long Term Goal 6 Progress: Progressing toward goal  Problem List Patient Active Problem List   Diagnosis Date Noted  . Muscle weakness (generalized) 09/05/2013  . Decreased range of motion of wrist 09/05/2013  . Decreased range of motion of finger 09/05/2013  . Pain in joint, hand 09/05/2013  . Hyperkalemia 02/15/2012  . HTN (hypertension) 02/15/2012  . Schizoaffective disorder, bipolar type 02/10/2012  . FRACTURE, HUMERUS, PROXIMAL 02/12/2008  . SUBLUXATION-RADIAL HEAD 02/12/2008    End of Session Activity Tolerance: Patient tolerated treatment well General Behavior During Therapy: [facility] for tasks assessed/performed  GO    [name], MS, OTR/L [phone]  09/11/2013, 11:56 AM

## 2013-09-13 ENCOUNTER — Ambulatory Visit (HOSPITAL_COMMUNITY)
Admission: RE | Admit: 2013-09-13 | Discharge: 2013-09-13 | Disposition: A | Discharge status: Home / Self Care | Payer: PRIVATE HEALTH INSURANCE | Source: Ambulatory Visit

## 2013-09-13 DIAGNOSIS — M25549 Pain in joints of unspecified hand: Secondary | ICD-10-CM

## 2013-09-13 DIAGNOSIS — M25639 Stiffness of unspecified wrist, not elsewhere classified: Secondary | ICD-10-CM

## 2013-09-13 DIAGNOSIS — M6281 Muscle weakness (generalized): Secondary | ICD-10-CM

## 2013-09-13 DIAGNOSIS — IMO0001 Reserved for inherently not codable concepts without codable children: Secondary | ICD-10-CM | POA: Diagnosis not present

## 2013-09-13 DIAGNOSIS — M25649 Stiffness of unspecified hand, not elsewhere classified: Secondary | ICD-10-CM

## 2013-09-13 NOTE — Progress Notes (Signed)
Occupational Therapy Treatment Patient Details  Name: Abelino DerrickRonda A Teodoro MRN: [id number] Date of Birth: [birth year]  Today's Date: 09/13/2013 Time: [id number] OT Time Calculation (min): 59 min Splinting 45' Patient education 14' Splint cost $97 Visit#: 3 of 16  Re-eval: 10/03/13    Authorization: [employer] [place]and [place]  Authorization Time Period: before 10th visit  Authorization Visit#: 3 of 10  Subjective Symptoms/Limitations Symptoms: S:  I am not sure that I will be able to put this on by myself. Pain Assessment Currently in Pain?: Yes Pain Score: 2  Pain Location: Wrist Pain Orientation: Right Pain Type: Acute pain  Precautions/Restrictions     Exercise/Treatments    Splinting Splinting: Fabricated dorsal based right wrist extension splint with [employer] Outrigger.  Splint positions her wrist in approximately 20 degrees of extension, outrigger is forearm based and allows digits, including thumb to rest in functional resting position.  Splint allows patient to actively use hand to pick up and manipulate items and when not using her hand functionally, she is able to rest in wrist extension and functional position with digits to promote radial nerve innervation/healing.   Therapist assisted and educated patient on donning and doffing splint several times and took picture of splint with patients phone so that she could refer to this info when attempting to don and doff at home.  WIll continue to require assistance donning and doffing for the time being.  Educted on contraindications and wear and care schedule.  Paitent to wear at all times except when sleeping and can remove for hygiene purposes.    Occupational Therapy Assessment and Plan OT Assessment and Plan Clinical Impression Statement: A:  Fabricated dorsal based wrist extension splint for patient.  Patient currently requires assistance donning and doffing splint.  OT Plan: P:  Follow up on splint use,  increase to sba for donning and doffing splint.  Complete functional activities without and with splint.    Goals Short Term Goals Short Term Goal 1: Pt will be educated on HEP Short Term Goal 2: Pt will be educated on wear and care of radial nerve splint and demonstrated abillity to don/doff as needed. Short Term Goal 2 Progress: Progressing toward goal Short Term Goal 3: Pt will improve right digit extension to 50% in gravity elminated position in working towards improved grasp and release. Short Term Goal 4: Pt will improve right grip strength by atleast 10 lbs for improved ability to cook. Short Term Goal 5: Pt will improve right wrist extension to [facility] range in gravity eliminated position. Long Term Goals Long Term Goal 1: Pt will return to prior level of functioning in all ADL, IADL, and leisure tasks, Long Term Goal 2: Pt will improve right digit extension to WNL range for improved ability to use fork to eat. Long Term Goal 3: Pt will improve right grip strength by atleast 25 lbs for decreased drops while completing ADL tasks. Long Term Goal 4: Pt will improve right wrist extension to [facility] range in gravity plane for improved engagement in ADL tasks. Long Term Goal 5: Pt will verbalize no difficulties, 5/5 times, with donning seatbelt in her car. Additional Long Term Goals?: Yes Long Term Goal 6: Pt will have pain of less than 1/10 in her right hand.  Problem List Patient Active Problem List   Diagnosis Date Noted  . Muscle weakness (generalized) 09/05/2013  . Decreased range of motion of wrist 09/05/2013  . Decreased range of motion of finger 09/05/2013  .  Pain in joint, hand 09/05/2013  . Hyperkalemia 02/15/2012  . HTN (hypertension) 02/15/2012  . Schizoaffective disorder, bipolar type 02/10/2012  . FRACTURE, HUMERUS, PROXIMAL 02/12/2008  . SUBLUXATION-RADIAL HEAD 02/12/2008    End of Session Activity Tolerance: Patient tolerated treatment well General Behavior During  Therapy: [facility] for tasks assessed/performed  GO Functional Assessment Tool Used: FOTO 30/100  Jacqualine CodeBethany Helene Json Koelzer, OTR/L 09/13/2013, 4:01 PM

## 2013-09-17 ENCOUNTER — Ambulatory Visit (HOSPITAL_COMMUNITY): Payer: Self-pay | Admitting: Specialist

## 2013-09-20 ENCOUNTER — Ambulatory Visit (HOSPITAL_COMMUNITY): Payer: Self-pay

## 2013-09-20 ENCOUNTER — Ambulatory Visit (HOSPITAL_COMMUNITY)
Admission: RE | Admit: 2013-09-20 | Discharge: 2013-09-20 | Disposition: A | Discharge status: Home / Self Care | Payer: PRIVATE HEALTH INSURANCE | Source: Ambulatory Visit | Attending: Neurology | Admitting: Neurology

## 2013-09-20 DIAGNOSIS — M25639 Stiffness of unspecified wrist, not elsewhere classified: Secondary | ICD-10-CM

## 2013-09-20 DIAGNOSIS — M6281 Muscle weakness (generalized): Secondary | ICD-10-CM

## 2013-09-20 DIAGNOSIS — M25649 Stiffness of unspecified hand, not elsewhere classified: Secondary | ICD-10-CM

## 2013-09-20 DIAGNOSIS — M25549 Pain in joints of unspecified hand: Secondary | ICD-10-CM

## 2013-09-20 NOTE — Progress Notes (Signed)
Occupational Therapy Treatment Patient Details  Name: Melinda Wood MRN: [id number] Date of Birth: 28-Jun-1955  Today's Date: 09/20/2013 Time: [id number] OT Time Calculation (min): 40 min splinting [id number] 20' Neuro reeducation [phone] 20'  Visit#: 4 of 16  Re-eval: 10/03/13    Authorization: [employer] (and [place]  Authorization Time Period: before 10th visit  Authorization Visit#: 4 of 10  Subjective S:  Im able to do alot more, like feed myself and dust. Pain Assessment Currently in Pain?: No/denies  Precautions/Restrictions   progress as tolerated  Exercise/Treatments Wrist Exercises Forearm Supination: PROM;AROM;10 reps Forearm Pronation: PROM;AROM;10 reps Wrist Flexion: PROM;AROM;10 reps Wrist Extension: PROM;AAROM;10 reps Wrist Radial Deviation: PROM;AAROM;10 reps Wrist Ulnar Deviation: PROM;AAROM;10 reps   Sponges: 7, 9 for in hand manipulation, then picked up 15 sponges using thumb and alternating digits to work on pincer grasp, then used tongs to pick up 10 sponges to improve dynamic tripod grasp needed for improved handwriting.  all with splint on  Hand Exercises Joint Blocking Exercises: 5 times PROM each joint, each digit Digit Composite Abduction: AROM;10 reps Digit Composite Adduction: AROM;10 reps Sponges: 7, 9 for in hand manipulation, then picked up 15 sponges using thumb and alternating digits to work on pincer grasp, then used tongs to pick up 10 sponges to improve dynamic tripod grasp needed for improved handwriting.  all with splint on Other Hand Exercises: placed all large shells and small shells from table into upright container demonstrating good in hand manipulation all with splint on Other Hand Exercises: handwriting wrote her name address and phone number with pencil with good legibiity using print, unable to use cursive at this time and patient would like to use cursive again.     Splinting Splinting: reviewed donning  and doffing splint this date and patient demonstrated independence.  Patient had one place on ulnar border that was irritating her and I placed foam on splint to cushion area.  Tighted 5th digit rubberband and adjust position of 4th digit outrigger.    Occupational Therapy Assessment and Plan OT Assessment and Plan Clinical Impression Statement: A:  Patient demonstrated independence with donning splint this date.  OT Plan: P:  Attempt pinch tree and texting with splint on for increasd independence with right hand use.    Goals Short Term Goals Short Term Goal 1: Pt will be educated on HEP Short Term Goal 1 Progress: Progressing toward goal Short Term Goal 2: Pt will be educated on wear and care of radial nerve splint and demonstrated abillity to don/doff as needed. Short Term Goal 2 Progress: Progressing toward goal Short Term Goal 3: Pt will improve right digit extension to 50% in gravity elminated position in working towards improved grasp and release. Short Term Goal 3 Progress: Progressing toward goal Short Term Goal 4: Pt will improve right grip strength by atleast 10 lbs for improved ability to cook. Short Term Goal 4 Progress: Progressing toward goal Short Term Goal 5: Pt will improve right wrist extension to [facility] range in gravity eliminated position. Short Term Goal 5 Progress: Progressing toward goal Long Term Goals Long Term Goal 1: Pt will return to prior level of functioning in all ADL, IADL, and leisure tasks, Long Term Goal 1 Progress: Progressing toward goal Long Term Goal 2: Pt will improve right digit extension to WNL range for improved ability to use fork to eat. Long Term Goal 2 Progress: Progressing toward goal Long Term Goal 3: Pt will improve right grip strength by atleast  25 lbs for decreased drops while completing ADL tasks. Long Term Goal 3 Progress: Progressing toward goal Long Term Goal 4: Pt will improve right wrist extension to [facility] range in gravity plane for  improved engagement in ADL tasks. Long Term Goal 4 Progress: Progressing toward goal Long Term Goal 5: Pt will verbalize no difficulties, 5/5 times, with donning seatbelt in her car. Long Term Goal 5 Progress: Progressing toward goal Additional Long Term Goals?: Yes Long Term Goal 6: Pt will have pain of less than 1/10 in her right hand. Long Term Goal 6 Progress: Progressing toward goal  Problem List Patient Active Problem List   Diagnosis Date Noted  . Muscle weakness (generalized) 09/05/2013  . Decreased range of motion of wrist 09/05/2013  . Decreased range of motion of finger 09/05/2013  . Pain in joint, hand 09/05/2013  . Hyperkalemia 02/15/2012  . HTN (hypertension) 02/15/2012  . Schizoaffective disorder, bipolar type 02/10/2012  . FRACTURE, HUMERUS, PROXIMAL 02/12/2008  . SUBLUXATION-RADIAL HEAD 02/12/2008    End of Session Activity Tolerance: Patient tolerated treatment well General Behavior During Therapy: [facility] for tasks assessed/performed  GO    Shirlean MylarBethany H. Elyan Vanwieren, OTR/L [phone]  09/20/2013, 2:06 PM

## 2013-09-21 ENCOUNTER — Inpatient Hospital Stay (HOSPITAL_COMMUNITY): Admission: RE | Admit: 2013-09-21 | Payer: Self-pay | Source: Ambulatory Visit

## 2013-09-24 ENCOUNTER — Ambulatory Visit (HOSPITAL_COMMUNITY): Payer: Self-pay

## 2013-09-27 ENCOUNTER — Ambulatory Visit (HOSPITAL_COMMUNITY)
Admission: RE | Admit: 2013-09-27 | Discharge: 2013-09-27 | Disposition: A | Discharge status: Home / Self Care | Payer: PRIVATE HEALTH INSURANCE | Source: Ambulatory Visit | Attending: Family Medicine | Admitting: Family Medicine

## 2013-09-27 ENCOUNTER — Inpatient Hospital Stay (HOSPITAL_COMMUNITY): Admission: RE | Admit: 2013-09-27 | Payer: Self-pay | Source: Ambulatory Visit

## 2013-09-27 DIAGNOSIS — I10 Essential (primary) hypertension: Secondary | ICD-10-CM | POA: Insufficient documentation

## 2013-09-27 DIAGNOSIS — M21339 Wrist drop, unspecified wrist: Secondary | ICD-10-CM | POA: Diagnosis not present

## 2013-09-27 DIAGNOSIS — M25639 Stiffness of unspecified wrist, not elsewhere classified: Secondary | ICD-10-CM

## 2013-09-27 DIAGNOSIS — IMO0001 Reserved for inherently not codable concepts without codable children: Secondary | ICD-10-CM | POA: Diagnosis not present

## 2013-09-27 DIAGNOSIS — M25649 Stiffness of unspecified hand, not elsewhere classified: Secondary | ICD-10-CM

## 2013-09-27 DIAGNOSIS — M6281 Muscle weakness (generalized): Secondary | ICD-10-CM | POA: Diagnosis not present

## 2013-09-27 DIAGNOSIS — M25549 Pain in joints of unspecified hand: Secondary | ICD-10-CM

## 2013-09-27 NOTE — Progress Notes (Signed)
Occupational Therapy Treatment Patient Details  Name: Melinda DerrickRonda A Finks MRN: [id number] Date of Birth: 1955-07-10  Today's Date: 09/27/2013 Time: [id number] OT Time Calculation (min): 37 min Splinting [id number] 10' Theract [id number] 27'  Visit#: 5 of 16  Re-eval: 10/03/13    Authorization: [employer] (and [place]  Authorization Time Period: before 10th visit  Authorization Visit#: 5 of 10  Subjective Symptoms/Limitations Symptoms: S: My rubberband broke and I haven't been able to use it.  Pain Assessment Currently in Pain?: Yes Pain Score: 8  Pain Location: Wrist Pain Orientation: Right Pain Type: Acute pain  Precautions/Restrictions  Precautions Precautions: None  Exercise/Treatments Hand Exercises Thumb Opposition: With splint on; 5X each finger to thumb. Sponges: 5 sponges picked up one at time, held in palm of hand then placed in container.  Other Hand Exercises: Resistive clothespins placed on vertical bar, yellow, red, and green placed/removed. min difficulty.  Other Hand Exercises: Patient unscrewed 2 nuts from nuts/bolts board with splint on. Mod difficulty. Patient with mild frustration.      Splinting Splinting: Tightened all rubberbands as they had become lax. Tightened ring finger outrigger. Attached new straps.   Occupational Therapy Assessment and Plan OT Assessment and Plan Clinical Impression Statement: A: Patient required some maintenance to splint this date. Completed some pinch and fine motor tasks to increase functional performance when wearing splint. Patient has mild frustration when presented with a hard task. Doesn't quite understand the purpose behind completing  a challenging task. OT Plan: P: Attempt texting with splint on for increased independence with right hand use. Fine motor task with splint on.    Goals Short Term Goals Time to Complete Short Term Goals: 4 weeks Short Term Goal 1: Pt will be educated on HEP Short Term  Goal 1 Progress: Progressing toward goal Short Term Goal 2: Pt will be educated on wear and care of radial nerve splint and demonstrated abillity to don/doff as needed. Short Term Goal 2 Progress: Progressing toward goal Short Term Goal 3: Pt will improve right digit extension to 50% in gravity elminated position in working towards improved grasp and release. Short Term Goal 3 Progress: Progressing toward goal Short Term Goal 4: Pt will improve right grip strength by atleast 10 lbs for improved ability to cook. Short Term Goal 4 Progress: Progressing toward goal Short Term Goal 5: Pt will improve right wrist extension to [facility] range in gravity eliminated position. Short Term Goal 5 Progress: Progressing toward goal Long Term Goals Time to Complete Long Term Goals: 8 weeks Long Term Goal 1: Pt will return to prior level of functioning in all ADL, IADL, and leisure tasks, Long Term Goal 1 Progress: Progressing toward goal Long Term Goal 2: Pt will improve right digit extension to WNL range for improved ability to use fork to eat. Long Term Goal 2 Progress: Progressing toward goal Long Term Goal 3: Pt will improve right grip strength by atleast 25 lbs for decreased drops while completing ADL tasks. Long Term Goal 3 Progress: Progressing toward goal Long Term Goal 4: Pt will improve right wrist extension to [facility] range in gravity plane for improved engagement in ADL tasks. Long Term Goal 4 Progress: Progressing toward goal Long Term Goal 5: Pt will verbalize no difficulties, 5/5 times, with donning seatbelt in her car. Long Term Goal 5 Progress: Progressing toward goal Additional Long Term Goals?: Yes Long Term Goal 6: Pt will have pain of less than 1/10 in her right hand. Long Term Goal  6 Progress: Progressing toward goal  Problem List Patient Active Problem List   Diagnosis Date Noted  . Muscle weakness (generalized) 09/05/2013  . Decreased range of motion of wrist 09/05/2013  . Decreased  range of motion of finger 09/05/2013  . Pain in joint, hand 09/05/2013  . Hyperkalemia 02/15/2012  . HTN (hypertension) 02/15/2012  . Schizoaffective disorder, bipolar type 02/10/2012  . FRACTURE, HUMERUS, PROXIMAL 02/12/2008  . SUBLUXATION-RADIAL HEAD 02/12/2008    End of Session Activity Tolerance: Patient tolerated treatment well General Behavior During Therapy: [facility] for tasks assessed/performed   Limmie PatriciaLaura Yuvin Bussiere, OTR/L,CBIS   09/27/2013, 3:17 PM

## 2013-10-05 ENCOUNTER — Ambulatory Visit (HOSPITAL_COMMUNITY)
Admission: RE | Admit: 2013-10-05 | Discharge: 2013-10-05 | Disposition: A | Discharge status: Home / Self Care | Payer: PRIVATE HEALTH INSURANCE | Source: Ambulatory Visit | Attending: Family Medicine | Admitting: Family Medicine

## 2013-10-05 DIAGNOSIS — IMO0001 Reserved for inherently not codable concepts without codable children: Secondary | ICD-10-CM | POA: Diagnosis not present

## 2013-10-05 NOTE — Progress Notes (Signed)
Occupational Therapy Treatment Patient Details  Name: Melinda Wood MRN: [id number] Date of Birth: [birth year]  Today's Date: 10/05/2013 Time: 1526-1610 OT Time Calculation (min): 44 min Orthotocis Management 1526-1600 (34') There Ex 1600--1610 (10')  Visit#: 6 of 16  Re-eval: 10/03/13    Authorization: [employer] (and [place]  Authorization Time Period: before 10th visit  Authorization Visit#: 6 of 10  Subjective Symptoms/Limitations Symptoms: "I broke my splint! I was trying to reach a bowl."  Precautions/Restrictions     Exercise/Treatments Hand Exercises Sponges: 10, 9, 8, 10, 10 Other Hand Exercises: Resistive clothepins places on horizontal bar - blue, and green. Pt had mod difficulty due to inability to use splint for exercise.     Splinting Splinting: Pt brought in splint due to detachment of outriggin from the boyd of the splint.  Re-headed both the attachement surface of the body of the splint and the outrigging attachment surface and re-molded together.  also placed 2 support thermoplst strips across the outriggin attachemnt to reinforce the area.  Tightened rubberbands on outriggin per pt request, and and tightened 5th digit outrigger.    Occupational Therapy Assessment and Plan OT Assessment and Plan Clinical Impression Statement: pt presented splint with seperated body and outrigger this session - session focused on splint re-fabrication and maintenence.  Educated pt on nerve re-growth - pt very anxious to have use of her hand back.  Pt determined to use both resisted clothespins and sponges for there ex, despite not having splint availabld during splint re-modeling. OT Plan: P: Attempt texting with splint on for increased independence with right hand use. Fine motor task with splint on. Follow up on splint correction and stability. Re-Evaluation!   Goals Short Term Goals Short Term Goal 1: Pt will be educated on HEP Short Term Goal 1  Progress: Progressing toward goal Short Term Goal 2: Pt will be educated on wear and care of radial nerve splint and demonstrated abillity to don/doff as needed. Short Term Goal 2 Progress: Progressing toward goal Short Term Goal 3: Pt will improve right digit extension to 50% in gravity elminated position in working towards improved grasp and release. Short Term Goal 3 Progress: Progressing toward goal Short Term Goal 4: Pt will improve right grip strength by atleast 10 lbs for improved ability to cook. Short Term Goal 4 Progress: Progressing toward goal Short Term Goal 5: Pt will improve right wrist extension to [facility] range in gravity eliminated position. Short Term Goal 5 Progress: Progressing toward goal Additional Short Term Goals?: Yes Short Term Goal 6: Pt will have pain of less than 5/10 in right hand. Short Term Goal 6 Progress: Progressing toward goal Long Term Goals Long Term Goal 1: Pt will return to prior level of functioning in all ADL, IADL, and leisure tasks, Long Term Goal 1 Progress: Progressing toward goal Long Term Goal 2: Pt will improve right digit extension to WNL range for improved ability to use fork to eat. Long Term Goal 2 Progress: Progressing toward goal Long Term Goal 3: Pt will improve right grip strength by atleast 25 lbs for decreased drops while completing ADL tasks. Long Term Goal 3 Progress: Progressing toward goal Long Term Goal 4: Pt will improve right wrist extension to [facility] range in gravity plane for improved engagement in ADL tasks. Long Term Goal 4 Progress: Progressing toward goal Long Term Goal 5: Pt will verbalize no difficulties, 5/5 times, with donning seatbelt in her car. Long Term Goal 5 Progress: Progressing  toward goal Additional Long Term Goals?: Yes Long Term Goal 6: Pt will have pain of less than 1/10 in her right hand. Long Term Goal 6 Progress: Progressing toward goal  Problem List Patient Active Problem List   Diagnosis Date Noted  .  Muscle weakness (generalized) 09/05/2013  . Decreased range of motion of wrist 09/05/2013  . Decreased range of motion of finger 09/05/2013  . Pain in joint, hand 09/05/2013  . Hyperkalemia 02/15/2012  . HTN (hypertension) 02/15/2012  . Schizoaffective disorder, bipolar type 02/10/2012  . FRACTURE, HUMERUS, PROXIMAL 02/12/2008  . SUBLUXATION-RADIAL HEAD 02/12/2008    End of Session Activity Tolerance: Patient tolerated treatment well General Behavior During Therapy: [facility]WFL for tasks assessed/performed  GO    Melinda GuanMarie Rawlings, MS, OTR/L [phone]  10/05/2013, 4:28 PM

## 2013-10-10 ENCOUNTER — Telehealth (HOSPITAL_COMMUNITY): Payer: Self-pay

## 2013-10-10 NOTE — Telephone Encounter (Signed)
Date: 10/10/13  Spoke with patient today on telephone. Patient's last tx session at our clinic was 08/16/13. [place] and [place] unable to return to therapy due to distance of driving her scooter. Patient did mention that at her last appt with Dr. Romeo AppleHarrison, he had recommended she continue therapy. Patient was feeling good and has since returned to work and is now experiencing increased pain. Recommended patient [place].    Limmie PatriciaLaura Maham Quintin, OTR/L,CBIS

## 2013-10-10 NOTE — Telephone Encounter (Signed)
   Date: 10/10/13  Patient has not been in to see therapy since 10/05/13. Left message to let us know if she would like to make additional appts or would like to be discharged.   Limmie PatriciaLaura Arlana Canizales, OTR/L,CBIS

## 2013-10-25 ENCOUNTER — Ambulatory Visit (HOSPITAL_COMMUNITY)
Admission: RE | Admit: 2013-10-25 | Discharge: 2013-10-25 | Disposition: A | Discharge status: Home / Self Care | Payer: PRIVATE HEALTH INSURANCE | Source: Ambulatory Visit | Attending: Neurology | Admitting: Neurology

## 2013-10-25 DIAGNOSIS — IMO0001 Reserved for inherently not codable concepts without codable children: Secondary | ICD-10-CM | POA: Insufficient documentation

## 2013-10-25 DIAGNOSIS — I10 Essential (primary) hypertension: Secondary | ICD-10-CM | POA: Insufficient documentation

## 2013-10-25 DIAGNOSIS — M25639 Stiffness of unspecified wrist, not elsewhere classified: Secondary | ICD-10-CM | POA: Diagnosis not present

## 2013-10-25 DIAGNOSIS — M21339 Wrist drop, unspecified wrist: Secondary | ICD-10-CM | POA: Insufficient documentation

## 2013-10-25 DIAGNOSIS — M6281 Muscle weakness (generalized): Secondary | ICD-10-CM | POA: Insufficient documentation

## 2013-10-25 NOTE — Evaluation (Addendum)
Occupational Therapy Re-Evaluation  Patient Details  Name: Melinda Wood MRN: [id number] Date of Birth: 1956-04-21  Today's Date: 10/25/2013 Time: 9381-8299 OT Time Calculation (min): 43 min Re-Eval 1102-1130 (28') Self Care/HEP 1130-1145 (42')  Visit#: 7 of 16  Re-eval: 10/03/13  Assessment Diagnosis: Right Wrist Drop Next MD Visit: June 9th  Authorization: [place] (and [place])  Authorization Time Period: before 10th visit  Authorization Visit#: 7 of 10   Past Medical History:  Past Medical History  Diagnosis Date  . High cholesterol   . Asthma   . Hypertension   . Bipolar 1 disorder   . Urinary incontinence   . Anxiety   . Panic attacks    Past Surgical History:  Past Surgical History  Procedure Laterality Date  . Shoulder surgery    . Appendectomy      Subjective Symptoms/Limitations Symptoms: "I've got something to show you!" Pain Assessment Currently in Pain?: Yes Pain Score: 6  Pain Location: Wrist Pain Orientation: Right Pain Type: Acute pain  Assessment RUE AROM (degrees)    (Previous Measurements on 4/15) RUE Overall AROM Comments: pt assess in seated.  Pt has history of shoulder AROM deficits. Right Wrist Extension: 40 Degrees (trace) Right Wrist Flexion: 70 Degrees (70) Right Wrist Radial Deviation: 32 Degrees (18) Right Wrist Ulnar Deviation: 31 Degrees (26) Right Composite Finger Extension:  (Full - previous 25%) Right Composite Finger Flexion:  (Full) RUE Strength RUE Overall Strength Comments: Assess in seated Right Forearm Supination: 4/5 (3+/5) Right Wrist Flexion: 5/5 (5/5) Right Wrist Extension: 3/5 (2-/5) Grip (lbs): 22 (11) Lateral Pinch: 8 lbs (5) 3 Point Pinch: 5 lbs (6) Right Hand Strength - Pinch (lbs) Lateral Pinch: 8 lbs (5) 3 Point Pinch: 5 lbs (6)   Exercise/Treatments Hand Exercises Theraputty - Flatten: yellow putty Theraputty - Roll: yellow putty Theraputty - Grip: yellow putty Theraputty  - Pinch: yellow putty - 3 point and lateral pinches Theraputty - Locate Pegs: yellow putty - hid 5 medium beads in putty. Pt found with mod difficulty       Occupational Therapy Assessment and Plan OT Assessment and Plan Clinical Impression Statement: Pt presented this session for re-evaluation. She has significant improvments in her right wrist and deigit extension that she states began approximately 3 days ago.  She has met 5/6 STG and 3/6 LTG.  Pt presents with good range, but decreased strength in her RUE. Pt will benefit from skilled therapeutic intervention in order to improve on the following deficits: Impaired UE functional use;Decreased range of motion;Decreased strength;Impaired sensation Rehab Potential: Good OT Frequency: Min 1X/week OT Duration: 4 weeks OT Treatment/Interventions: Self-care/ADL training;Therapeutic exercise;Neuromuscular education;DME and/or AE instruction;Manual therapy;Modalities;Splinting;Therapeutic activities;Patient/family education OT Plan: P: Continue OT services for continued strengthening of RUE. Follow up on theraputty HEP.  Resisted clothepins, knot/screw board for coordination.  FOTO for G-codes   Goals Short Term Goals Short Term Goal 1: Pt will be educated on HEP Short Term Goal 1 Progress: Met Short Term Goal 2: Pt will be educated on wear and care of radial nerve splint and demonstrated abillity to don/doff as needed. Short Term Goal 2 Progress: Met Short Term Goal 3: Pt will improve right digit extension to 50% in gravity elminated position in working towards improved grasp and release. Short Term Goal 3 Progress: Met Short Term Goal 4: Pt will improve right grip strength by atleast 10 lbs for improved ability to cook. Short Term Goal 4 Progress: Met Short Term Goal 5: Pt  will improve right wrist extension to [facility] range in gravity eliminated position. Short Term Goal 5 Progress: Met Short Term Goal 6: Pt will have pain of less than 5/10 in  right hand. Short Term Goal 6 Progress: Progressing toward goal Long Term Goals Long Term Goal 1: Pt will return to prior level of functioning in all ADL, IADL, and leisure tasks, Long Term Goal 1 Progress: Progressing toward goal Long Term Goal 2: Pt will improve right digit extension to WNL range for improved ability to use fork to eat. Long Term Goal 2 Progress: Met Long Term Goal 3: Pt will improve right grip strength by atleast 25 lbs for decreased drops while completing ADL tasks. Long Term Goal 3 Progress: Progressing toward goal Long Term Goal 4: Pt will improve right wrist extension to [facility] range in gravity plane for improved engagement in ADL tasks. Long Term Goal 4 Progress: Met Long Term Goal 5: Pt will verbalize no difficulties, 5/5 times, with donning seatbelt in her car. Long Term Goal 5 Progress: Met Long Term Goal 6: Pt will have pain of less than 1/10 in her right hand. Long Term Goal 6 Progress: Progressing toward goal  Problem List Patient Active Problem List   Diagnosis Date Noted  . Muscle weakness (generalized) 09/05/2013  . Decreased range of motion of wrist 09/05/2013  . Decreased range of motion of finger 09/05/2013  . Pain in joint, hand 09/05/2013  . Hyperkalemia 02/15/2012  . HTN (hypertension) 02/15/2012  . Schizoaffective disorder, bipolar type 02/10/2012  . FRACTURE, HUMERUS, PROXIMAL 02/12/2008  . SUBLUXATION-RADIAL HEAD 02/12/2008    End of Session Activity Tolerance: Patient tolerated treatment well General Behavior During Therapy: WFL for tasks assessed/performed OT Plan of Care OT Home Exercise Plan: yellow theraputty for hand strengthening. OT Patient Instructions: Demondtrated and verbalized. Pt return demonstrated.  Handout provided (scanned) Consulted and Agree with Plan of Care: Patient  GO G-Codes Based on Clinical Judgement Current Status: CJ Goals Status: Lawrence Santiago, [place], OTR/L [phone]  10/25/2013, 12:08  PM  Physician Documentation Your signature is required to indicate approval of the treatment plan as stated above.  Please sign and either send electronically or make a copy of this report for your files and return this physician signed original.  Please mark one 1.__approve of plan  2. ___approve of plan with the following conditions.   ______________________________                                                          _____________________ Physician Signature                                                                                                             Date

## 2013-11-02 ENCOUNTER — Ambulatory Visit (HOSPITAL_COMMUNITY): Payer: Self-pay | Admitting: Specialist

## 2013-11-09 ENCOUNTER — Inpatient Hospital Stay (HOSPITAL_COMMUNITY): Admission: RE | Admit: 2013-11-09 | Payer: Self-pay | Source: Ambulatory Visit

## 2013-11-16 ENCOUNTER — Telehealth (HOSPITAL_COMMUNITY): Payer: Self-pay

## 2013-11-16 ENCOUNTER — Inpatient Hospital Stay (HOSPITAL_COMMUNITY)
Admission: RE | Admit: 2013-11-16 | Discharge: 2013-11-16 | Disposition: A | Discharge status: Home / Self Care | Payer: Self-pay | Source: Ambulatory Visit

## 2013-11-16 DIAGNOSIS — M25639 Stiffness of unspecified wrist, not elsewhere classified: Secondary | ICD-10-CM

## 2013-11-16 DIAGNOSIS — M6281 Muscle weakness (generalized): Secondary | ICD-10-CM

## 2013-11-16 DIAGNOSIS — M25649 Stiffness of unspecified hand, not elsewhere classified: Secondary | ICD-10-CM

## 2013-11-16 NOTE — Telephone Encounter (Signed)
Friday, 11/16/13 4:41PM  Called patient to check in about 2 missed appointments.  Pt states she no longer needs OT services and she is comfortable with being discharged.  [name], MS, OTR/L [phone]

## 2013-11-16 NOTE — Progress Notes (Signed)
  Patient Details  Name: Melinda Wood MRN: [id number] Date of Birth: 1955-11-23  Today's Date: 11/16/2013 Time:  4:43   Called pt to check in due to 2 missed appointments.  Pt verbalized that she no longer feels she needs to come, and that she is comfortable with therapy services being completed.  Pt will be discharged from OT services at this time.  [name], MS, OTR/L [phone]  11/16/2013, 4:43 PM

## 2013-11-22 ENCOUNTER — Inpatient Hospital Stay (HOSPITAL_COMMUNITY): Admission: RE | Admit: 2013-11-22 | Payer: Self-pay | Source: Ambulatory Visit

## 2014-01-22 NOTE — Addendum Note (Signed)
Encounter addended by: Milon Dikes, OT on: 01/22/2014  5:20 PM<BR>     Documentation filed: Clinical Notes

## 2014-01-31 ENCOUNTER — Emergency Department (HOSPITAL_COMMUNITY)
Admission: EM | Admit: 2014-01-31 | Discharge: 2014-02-01 | Disposition: A | Discharge status: Home / Self Care | Payer: PRIVATE HEALTH INSURANCE | Attending: Emergency Medicine | Admitting: Emergency Medicine

## 2014-01-31 ENCOUNTER — Encounter (HOSPITAL_COMMUNITY): Payer: Self-pay | Admitting: Emergency Medicine

## 2014-01-31 DIAGNOSIS — Z79899 Other long term (current) drug therapy: Secondary | ICD-10-CM | POA: Diagnosis not present

## 2014-01-31 DIAGNOSIS — R0602 Shortness of breath: Secondary | ICD-10-CM | POA: Insufficient documentation

## 2014-01-31 DIAGNOSIS — I10 Essential (primary) hypertension: Secondary | ICD-10-CM | POA: Diagnosis not present

## 2014-01-31 DIAGNOSIS — Z87891 Personal history of nicotine dependence: Secondary | ICD-10-CM | POA: Insufficient documentation

## 2014-01-31 DIAGNOSIS — F411 Generalized anxiety disorder: Secondary | ICD-10-CM | POA: Diagnosis not present

## 2014-01-31 DIAGNOSIS — J45901 Unspecified asthma with (acute) exacerbation: Secondary | ICD-10-CM | POA: Diagnosis not present

## 2014-01-31 DIAGNOSIS — E7800 Pure hypercholesterolemia, unspecified: Secondary | ICD-10-CM | POA: Diagnosis not present

## 2014-01-31 DIAGNOSIS — F319 Bipolar disorder, unspecified: Secondary | ICD-10-CM | POA: Diagnosis not present

## 2014-01-31 DIAGNOSIS — IMO0002 Reserved for concepts with insufficient information to code with codable children: Secondary | ICD-10-CM | POA: Diagnosis not present

## 2014-01-31 NOTE — ED Notes (Signed)
Pt having SOB, call EMS earlier today given Albuterol treatment, continued to have SOB, EMS called, given Solumedrol, Albuterol and Atrovent Treatments.

## 2014-02-01 ENCOUNTER — Emergency Department (HOSPITAL_COMMUNITY): Payer: PRIVATE HEALTH INSURANCE

## 2014-02-01 DIAGNOSIS — J45901 Unspecified asthma with (acute) exacerbation: Secondary | ICD-10-CM | POA: Diagnosis not present

## 2014-02-01 MED ORDER — ALBUTEROL SULFATE HFA 108 (90 BASE) MCG/ACT IN AERS
2.0000 | INHALATION_SPRAY | RESPIRATORY_TRACT | Status: DC | PRN
  Administered 2014-02-01: 2 via RESPIRATORY_TRACT
  Filled 2014-02-01: qty 6.7

## 2014-02-01 MED ORDER — PREDNISONE 50 MG PO TABS: 50.0000 mg | ORAL_TABLET | Freq: Every day | ORAL | Status: DC

## 2014-02-01 MED ORDER — IPRATROPIUM-ALBUTEROL 0.5-2.5 (3) MG/3ML IN SOLN
3.0000 mL | Freq: Once | RESPIRATORY_TRACT | Status: AC
  Administered 2014-02-01: 3 mL via RESPIRATORY_TRACT
  Filled 2014-02-01: qty 3

## 2014-02-01 NOTE — Discharge Instructions (Signed)
Asthma °Asthma is a recurring condition in which the airways tighten and narrow. Asthma can make it difficult to breathe. It can cause coughing, wheezing, and shortness of breath. Asthma episodes, also called asthma attacks, range from minor to life-threatening. Asthma cannot be cured, but medicines and lifestyle changes can help control it. °CAUSES °Asthma is believed to be caused by inherited (genetic) and environmental factors, but its exact cause is unknown. Asthma may be triggered by allergens, lung infections, or irritants in the air. Asthma triggers are different for each person. Common triggers include:  °· Animal dander. °· Dust mites. °· Cockroaches. °· Pollen from trees or grass. °· Mold. °· Smoke. °· Air pollutants such as dust, household cleaners, hair sprays, aerosol sprays, paint fumes, strong chemicals, or strong odors. °· Cold air, weather changes, and winds (which increase molds and pollens in the air). °· Strong emotional expressions such as crying or laughing hard. °· Stress. °· Certain medicines (such as aspirin) or types of drugs (such as beta-blockers). °· Sulfites in foods and drinks. Foods and drinks that may contain sulfites include dried fruit, potato chips, and sparkling grape juice. °· Infections or inflammatory conditions such as the flu, a cold, or an inflammation of the nasal membranes (rhinitis). °· Gastroesophageal reflux disease (GERD). °· Exercise or strenuous activity. °SYMPTOMS °Symptoms may occur immediately after asthma is triggered or many hours later. Symptoms include: °· Wheezing. °· Excessive nighttime or early morning coughing. °· Frequent or severe coughing with a common cold. °· Chest tightness. °· Shortness of breath. °DIAGNOSIS  °The diagnosis of asthma is made by a review of your medical history and a physical exam. Tests may also be performed. These may include: °· Lung function studies. These tests show how much air you breathe in and out. °· Allergy  tests. °· Imaging tests such as X-rays. °TREATMENT  °Asthma cannot be cured, but it can usually be controlled. Treatment involves identifying and avoiding your asthma triggers. It also involves medicines. There are 2 classes of medicine used for asthma treatment:  °· Controller medicines. These prevent asthma symptoms from occurring. They are usually taken every day. °· Reliever or rescue medicines. These quickly relieve asthma symptoms. They are used as needed and provide short-term relief. °Your health care provider will help you create an asthma action plan. An asthma action plan is a written plan for managing and treating your asthma attacks. It includes a list of your asthma triggers and how they may be avoided. It also includes information on when medicines should be taken and when their dosage should be changed. An action plan may also involve the use of a device called a peak flow meter. A peak flow meter measures how well the lungs are working. It helps you monitor your condition. °HOME CARE INSTRUCTIONS  °· Take medicines only as directed by your health care provider. Speak with your health care provider if you have questions about how or when to take the medicines. °· Use a peak flow meter as directed by your health care provider. Record and keep track of readings. °· Understand and use the action plan to help minimize or stop an asthma attack without needing to seek medical care. °· Control your home environment in the following ways to help prevent asthma attacks: °¨ Do not smoke. Avoid being exposed to secondhand smoke. °¨ Change your heating and air conditioning filter regularly. °¨ Limit your use of fireplaces and wood stoves. °¨ Get rid of pests (such as roaches and   mice) and their droppings.  Throw away plants if you see mold on them.  Clean your floors and dust regularly. Use unscented cleaning products.  Try to have someone else vacuum for you regularly. Stay out of rooms while they are  being vacuumed and for a short while afterward. If you vacuum, use a dust mask from a hardware store, a double-layered or microfilter vacuum cleaner bag, or a vacuum cleaner with a HEPA filter.  Replace carpet with wood, tile, or vinyl flooring. Carpet can trap dander and dust.  Use allergy-proof pillows, mattress covers, and box spring covers.  Wash bed sheets and blankets every week in hot water and dry them in a dryer.  Use blankets that are made of polyester or cotton.  Clean bathrooms and kitchens with bleach. If possible, have someone repaint the walls in these rooms with mold-resistant paint. Keep out of the rooms that are being cleaned and painted.  Wash hands frequently. SEEK MEDICAL CARE IF:   You have wheezing, shortness of breath, or a cough even if taking medicine to prevent attacks.  The colored mucus you cough up (sputum) is thicker than usual.  Your sputum changes from clear or white to yellow, green, gray, or bloody.  You have any problems that may be related to the medicines you are taking (such as a rash, itching, swelling, or trouble breathing).  You are using a reliever medicine more than 2-3 times per week.  Your peak flow is still at 50-79% of your personal best after following your action plan for 1 hour.  You have a fever. SEEK IMMEDIATE MEDICAL CARE IF:   You seem to be getting worse and are unresponsive to treatment during an asthma attack.  You are short of breath even at rest.  You get short of breath when doing very little physical activity.  You have difficulty eating, drinking, or talking due to asthma symptoms.  You develop chest pain.  You develop a fast heartbeat.  You have a bluish color to your lips or fingernails.  You are light-headed, dizzy, or faint.  Your peak flow is less than 50% of your personal best. MAKE SURE YOU:   Understand these instructions.  Will watch your condition.  Will get help right away if you are not  doing well or get worse. Document Released: 05/10/2005 Document Revised: 09/24/2013 Document Reviewed: 12/07/2012 [facility] Patient Information 2015 [place], [place]. This information is not intended to replace advice given to you by your health care provider. Make sure you discuss any questions you have with your health care provider.  Albuterol inhalation aerosol What is this medicine? ALBUTEROL (al [place]) is a bronchodilator. It helps open up the airways in your lungs to make it easier to breathe. This medicine is used to treat and to prevent bronchospasm. This medicine may be used for other purposes; ask your health care provider or pharmacist if you have questions. COMMON BRAND NAME(S): Proair HFA, Proventil, Proventil HFA, Respirol, Ventolin, Ventolin HFA What should I tell my health care provider before I take this medicine? They need to know if you have any of the following conditions: -diabetes -heart disease or irregular heartbeat -high blood pressure -pheochromocytoma -seizures -thyroid disease -an unusual or allergic reaction to albuterol, levalbuterol, sulfites, other medicines, foods, dyes, or preservatives -pregnant or trying to get pregnant -breast-feeding How should I use this medicine? This medicine is for inhalation through the mouth. Follow the directions on your prescription label. Take your medicine at  regular intervals. Do not use more often than directed. Make sure that you are using your inhaler correctly. Ask you doctor or health care provider if you have any questions. Talk to your pediatrician regarding the use of this medicine in children. Special care may be needed. Overdosage: If you think you have taken too much of this medicine contact a poison control center or emergency room at once. NOTE: This medicine is only for you. Do not share this medicine with others. What if I miss a dose? If you miss a dose, use it as soon as you can. If it is almost time  for your next dose, use only that dose. Do not use double or extra doses. What may interact with this medicine? -anti-infectives like chloroquine and pentamidine -caffeine -cisapride -diuretics -medicines for colds -medicines for depression or for emotional or psychotic conditions -medicines for weight loss including some herbal products -methadone -some antibiotics like clarithromycin, erythromycin, levofloxacin, and linezolid -some heart medicines -steroid hormones like dexamethasone, cortisone, hydrocortisone -theophylline -thyroid hormones This list may not describe all possible interactions. Give your health care provider a list of all the medicines, herbs, non-prescription drugs, or dietary supplements you use. Also tell them if you smoke, drink alcohol, or use illegal drugs. Some items may interact with your medicine. What should I watch for while using this medicine? Tell your doctor or health care professional if your symptoms do not improve. Do not use extra albuterol. If your asthma or bronchitis gets worse while you are using this medicine, call your doctor right away. If your mouth gets dry try chewing sugarless gum or sucking hard candy. Drink water as directed. What side effects may I notice from receiving this medicine? Side effects that you should report to your doctor or health care professional as soon as possible: -allergic reactions like skin rash, itching or hives, swelling of the face, lips, or tongue -breathing problems -chest pain -feeling faint or lightheaded, falls -high blood pressure -irregular heartbeat -fever -muscle cramps or weakness -pain, tingling, numbness in the hands or feet -vomiting Side effects that usually do not require medical attention (report to your doctor or health care professional if they continue or are bothersome): -cough -difficulty sleeping -headache -nervousness or trembling -stomach upset -stuffy or runny nose -throat  irritation -unusual taste This list may not describe all possible side effects. Call your doctor for medical advice about side effects. You may report side effects to FDA at 1-800-FDA-1088. Where should I keep my medicine? Keep out of the reach of children. Store at room temperature between 15 and 30 degrees C (59 and 86 degrees F). The contents are under pressure and may burst when exposed to heat or flame. Do not freeze. This medicine does not work as well if it is too cold. Throw away any unused medicine after the expiration date. Inhalers need to be thrown away after the labeled number of puffs have been used or by the expiration date; whichever comes first. Ventolin HFA should be thrown away 12 months after removing from foil pouch. Check the instructions that come with your medicine. NOTE: This sheet is a summary. It may not cover all possible information. If you have questions about this medicine, talk to your doctor, pharmacist, or health care provider.  2015, [employer] Standard. (2012-10-26 10:57:17)  Prednisone tablets What is this medicine? PREDNISONE (PRED ni sone) is a corticosteroid. It is commonly used to treat inflammation of the skin, joints, lungs, and other organs. Common conditions treated  include asthma, allergies, and arthritis. It is also used for other conditions, such as blood disorders and diseases of the adrenal glands. This medicine may be used for other purposes; ask your health care provider or pharmacist if you have questions. COMMON BRAND NAME(S): Deltasone, Predone, Sterapred, Sterapred DS What should I tell my health care provider before I take this medicine? They need to know if you have any of these conditions: -Cushing's syndrome -diabetes -glaucoma -heart disease -high blood pressure -infection (especially a virus infection such as chickenpox, cold sores, or herpes) -kidney disease -liver disease -mental illness -myasthenia  gravis -osteoporosis -seizures -stomach or intestine problems -thyroid disease -an unusual or allergic reaction to lactose, prednisone, other medicines, foods, dyes, or preservatives -pregnant or trying to get pregnant -breast-feeding How should I use this medicine? Take this medicine by mouth with a glass of water. Follow the directions on the prescription label. Take this medicine with food. If you are taking this medicine once a day, take it in the morning. Do not take more medicine than you are told to take. Do not suddenly stop taking your medicine because you may develop a severe reaction. Your doctor will tell you how much medicine to take. If your doctor wants you to stop the medicine, the dose may be slowly lowered over time to avoid any side effects. Talk to your pediatrician regarding the use of this medicine in children. Special care may be needed. Overdosage: If you think you have taken too much of this medicine contact a poison control center or emergency room at once. NOTE: This medicine is only for you. Do not share this medicine with others. What if I miss a dose? If you miss a dose, take it as soon as you can. If it is almost time for your next dose, talk to your doctor or health care professional. You may need to miss a dose or take an extra dose. Do not take double or extra doses without advice. What may interact with this medicine? Do not take this medicine with any of the following medications: -metyrapone -mifepristone This medicine may also interact with the following medications: -aminoglutethimide -amphotericin B -aspirin and aspirin-like medicines -barbiturates -certain medicines for diabetes, like glipizide or glyburide -cholestyramine -cholinesterase inhibitors -cyclosporine -digoxin -diuretics -ephedrine -female hormones, like estrogens and birth control pills -isoniazid -ketoconazole -NSAIDS, medicines for pain and inflammation, like ibuprofen or  naproxen -phenytoin -rifampin -toxoids -vaccines -warfarin This list may not describe all possible interactions. Give your health care provider a list of all the medicines, herbs, non-prescription drugs, or dietary supplements you use. Also tell them if you smoke, drink alcohol, or use illegal drugs. Some items may interact with your medicine. What should I watch for while using this medicine? Visit your doctor or health care professional for regular checks on your progress. If you are taking this medicine over a prolonged period, carry an identification card with your name and address, the type and dose of your medicine, and your doctor's name and address. This medicine may increase your risk of getting an infection. Tell your doctor or health care professional if you are around anyone with measles or chickenpox, or if you develop sores or blisters that do not heal properly. If you are going to have surgery, tell your doctor or health care professional that you have taken this medicine within the last twelve months. Ask your doctor or health care professional about your diet. You may need to lower the amount of salt you  eat. This medicine may affect blood sugar levels. If you have diabetes, check with your doctor or health care professional before you change your diet or the dose of your diabetic medicine. What side effects may I notice from receiving this medicine? Side effects that you should report to your doctor or health care professional as soon as possible: -allergic reactions like skin rash, itching or hives, swelling of the face, lips, or tongue -changes in emotions or moods -changes in vision -depressed mood -eye pain -fever or chills, cough, sore throat, pain or difficulty passing urine -increased thirst -swelling of ankles, feet Side effects that usually do not require medical attention (report to your doctor or health care professional if they continue or are  bothersome): -confusion, excitement, restlessness -headache -nausea, vomiting -skin problems, acne, thin and shiny skin -trouble sleeping -weight gain This list may not describe all possible side effects. Call your doctor for medical advice about side effects. You may report side effects to FDA at 1-800-FDA-1088. Where should I keep my medicine? Keep out of the reach of children. Store at room temperature between 15 and 30 degrees C (59 and 86 degrees F). Protect from light. Keep container tightly closed. Throw away any unused medicine after the expiration date. NOTE: This sheet is a summary. It may not cover all possible information. If you have questions about this medicine, talk to your doctor, pharmacist, or health care provider.  2015, [employer] Standard. (2010-12-24 10:57:14)

## 2014-02-01 NOTE — ED Provider Notes (Addendum)
CSN: [id number]     Arrival date & time 01/31/14  2336 History   First MD Initiated Contact with Patient 02/01/14 0057     Chief Complaint  Patient presents with  . Shortness of Breath     (Consider location/radiation/quality/duration/timing/severity/associated sxs/prior Treatment) Patient is a 58 y.o. female presenting with shortness of breath. The history is provided by the patient.  Shortness of Breath She has a history of asthma and states that she had run out of her Symbicort. Over the last 3 days, she has had a nonproductive cough and dyspnea. Dyspnea is worse with walking and worse with laying flat. She denies fever, chills, sweats. Her chest has felt tight and this is typical of labs of her asthma. She denies nausea, vomiting. She denies any chest pain. She has not done anything to treat her symptoms. She called EMS who gave her a breathing treatment with improvement and she declined transport. Symptoms worsened and she called for EMS again and was given another breathing treatment as well as methylprednisolone. She is feeling considerably better now.  Past Medical History  Diagnosis Date  . High cholesterol   . Asthma   . Hypertension   . Bipolar 1 disorder   . Urinary incontinence   . Anxiety   . Panic attacks    Past Surgical History  Procedure Laterality Date  . Shoulder surgery    . Appendectomy     History reviewed. No pertinent family history. History  Substance Use Topics  . Smoking status: Former Smoker -- 0.50 packs/day for 10 years    Types: Cigarettes  . Smokeless tobacco: Never Used  . Alcohol Use: No   OB History   Grav Para Term Preterm Abortions TAB SAB Ect Mult Living   Review of Systems  Respiratory: Positive for shortness of breath.   All other systems reviewed and are negative.     Allergies  Arthrotec and Sulfonamide derivatives  Home Medications   Prior to Admission medications   Medication Sig Start Date End Date  Taking? Authorizing Provider  amLODipine (NORVASC) 5 MG tablet Take 1 tablet (5 mg total) by mouth daily. For blood pressure control. 02/16/12  Yes Curlene Labrum Readling, MD  ATELVIA 35 MG TBEC Take 35 mg by mouth every 7 (seven) days. 07/04/13  Yes Historical Provider, MD  atorvastatin (LIPITOR) 20 MG tablet Take 1 tablet (20 mg total) by mouth at bedtime. For cholesterol control. 02/16/12  Yes Curlene Labrum Readling, MD  budesonide-formoterol (SYMBICORT) 160-4.5 MCG/ACT inhaler Inhale 2 puffs into the lungs 2 (two) times daily.   Yes Historical Provider, MD  calcium-vitamin D (OSCAL WITH D) 500-200 MG-UNIT per tablet Take 1 tablet by mouth 2 (two) times daily in the am and at bedtime.. For bone health. 02/16/12  Yes Curlene Labrum Readling, MD  diazepam (VALIUM) 5 MG tablet Take 5 mg by mouth 3 (three) times daily. 07/24/13  Yes Historical Provider, MD  divalproex (DEPAKOTE ER) 500 MG 24 hr tablet Take 2 tablets (1,000 mg total) by mouth at bedtime. For mood stabilization. 02/16/12  Yes Curlene Labrum Readling, MD  fesoterodine (TOVIAZ) 4 MG TB24 Take 1 tablet (4 mg total) by mouth daily. Urinary incontinence 02/16/12  Yes Ronny Bacon, MD  methylphenidate (RITALIN) 20 MG tablet Take 20 mg by mouth 3 (three) times daily. 08/06/13  Yes Historical Provider, MD  naphazoline (NAPHCON) 0.1 % ophthalmic solution Place 1  drop into both eyes daily. For dry eyes. 02/16/12  Yes Curlene Labrum Readling, MD  OLANZapine (ZYPREXA) 10 MG tablet Take 1 tablet (10 mg total) by mouth at bedtime. For mood stabilization, sleep and clarity of thoughts. 02/16/12  Yes Ronny Bacon, MD  oxyCODONE-acetaminophen (PERCOCET/ROXICET) 5-325 MG per tablet Take 1 tablet by mouth 4 (four) times daily. 08/09/13  Yes Historical Provider, MD  oxyCODONE-acetaminophen (PERCOCET/ROXICET) 5-325 MG per tablet Take 1 tablet by mouth every 4 (four) hours as needed for severe pain. 08/18/13  Yes Raeford Razor, MD  PARoxetine (PAXIL) 40 MG tablet Take 1 tablet (40 mg total) by  mouth daily. For depression and anxiety. 02/16/12  Yes Curlene Labrum Readling, MD  pregabalin (LYRICA) 50 MG capsule Take 50 mg by mouth at bedtime. For chronic pain. 02/16/12  Yes Ronny Bacon, MD  traZODone (DESYREL) 100 MG tablet Take 1 tablet (100 mg total) by mouth at bedtime. For sleep. 02/16/12  Yes Curlene Labrum Readling, MD  vitamin B-12 1000 MCG tablet Take 1 tablet (1,000 mcg total) by mouth daily. For nutritional supplementation. 02/16/12  Yes Curlene Labrum Readling, MD  vitamin C (VITAMIN C) 500 MG tablet Take 1 tablet (500 mg total) by mouth daily. For nutritional supplementation. 02/16/12  Yes Curlene Labrum Readling, MD  CHANTIX STARTING MONTH PAK 0.5 MG X 11 & 1 MG X 42 tablet Take 0.5 mg by mouth daily. 07/12/13   Historical Provider, MD  feeding supplement (ENSURE COMPLETE) LIQD Take 237 mLs by mouth 3 (three) times daily between meals. For nutritional supplementation. 02/16/12   Curlene Labrum Readling, MD   BP 120/95  Pulse 101  Temp(Src) 99.8 F (37.7 C) (Oral)  Resp 28  Ht  (1.626 m)  Wt 135 lb (61.236 kg)  BMI 23.16 kg/m2  SpO2 96% Physical Exam  Nursing note and vitals reviewed.  58 year old female, resting comfortably and in no acute distress. Vital signs are significant for tachypnea, borderline tachycardia, and mild hypertension. Oxygen saturation is 96%, which is normal. Head is normocephalic and atraumatic. PERRLA, EOMI. Oropharynx is clear. Neck is nontender and supple without adenopathy or JVD. Back is nontender and there is no CVA tenderness. Lungs have a prolonged exhalation phase with scattered expiratory wheezes. There are no rales or rhonchi. Chest is nontender. Heart has regular rate and rhythm without murmur. Abdomen is soft, flat, nontender without masses or hepatosplenomegaly and peristalsis is normoactive. Extremities have no cyanosis or edema, full range of motion is present. Skin is warm and dry without rash. Neurologic: Mental status is normal, cranial nerves are intact,  there are no motor or sensory deficits.  ED Course  Procedures (including critical care time)  Imaging Review Dg Chest 2 View (if Patient Has Fever And/or Copd)  02/01/2014   CLINICAL DATA:  Shortness of breath, cough, chest pain  EXAM: CHEST  2 VIEW  COMPARISON:  01/01/2012  FINDINGS: Chronic interstitial markings/emphysematous changes. No focal consolidation. No pleural effusion or pneumothorax.  Heart is normal in size.  Degenerative changes of the visualized thoracolumbar spine. Status post ORIF of the left proximal humerus.  IMPRESSION: No evidence of acute cardiopulmonary disease.   Electronically Signed   By: Charline Bills M.D.   On: 02/01/2014 00:42   MDM   Final diagnoses:  Asthma exacerbation    Exacerbation of asthma with bronchitis. Chest x-ray shows no evidence of pneumonia. She was given additional nebulizer treatments with albuterol and ipratropium and discharged with an albuterol inhaler  and prescription for prednisone.  She feels much better after second nebulizer treatment. On exam, lungs are clear. She is discharged with a prescription for prednisone and she is given an albuterol inhaler to take home with her. She is to contact her PCP to have her prescription for Symbicort refilled.  Dione Booze, MD 02/01/14 1610  Dione Booze, MD 02/01/14 [phone]

## 2014-02-06 ENCOUNTER — Inpatient Hospital Stay (HOSPITAL_COMMUNITY)
Admission: EM | Admit: 2014-02-06 | Discharge: 2014-02-15 | DRG: 190 | Disposition: A | Discharge status: Home Health | Payer: PRIVATE HEALTH INSURANCE | Attending: Family Medicine | Admitting: Family Medicine

## 2014-02-06 ENCOUNTER — Encounter (HOSPITAL_COMMUNITY): Payer: Self-pay | Admitting: Emergency Medicine

## 2014-02-06 ENCOUNTER — Emergency Department (HOSPITAL_COMMUNITY): Payer: PRIVATE HEALTH INSURANCE

## 2014-02-06 DIAGNOSIS — F411 Generalized anxiety disorder: Secondary | ICD-10-CM | POA: Diagnosis present

## 2014-02-06 DIAGNOSIS — J441 Chronic obstructive pulmonary disease with (acute) exacerbation: Secondary | ICD-10-CM

## 2014-02-06 DIAGNOSIS — Z79899 Other long term (current) drug therapy: Secondary | ICD-10-CM

## 2014-02-06 DIAGNOSIS — F250 Schizoaffective disorder, bipolar type: Secondary | ICD-10-CM | POA: Diagnosis present

## 2014-02-06 DIAGNOSIS — D7589 Other specified diseases of blood and blood-forming organs: Secondary | ICD-10-CM | POA: Diagnosis present

## 2014-02-06 DIAGNOSIS — E785 Hyperlipidemia, unspecified: Secondary | ICD-10-CM | POA: Diagnosis present

## 2014-02-06 DIAGNOSIS — J45901 Unspecified asthma with (acute) exacerbation: Secondary | ICD-10-CM | POA: Diagnosis present

## 2014-02-06 DIAGNOSIS — J189 Pneumonia, unspecified organism: Secondary | ICD-10-CM | POA: Diagnosis present

## 2014-02-06 DIAGNOSIS — G43909 Migraine, unspecified, not intractable, without status migrainosus: Secondary | ICD-10-CM | POA: Diagnosis present

## 2014-02-06 DIAGNOSIS — R0902 Hypoxemia: Secondary | ICD-10-CM | POA: Diagnosis present

## 2014-02-06 DIAGNOSIS — E7800 Pure hypercholesterolemia, unspecified: Secondary | ICD-10-CM | POA: Diagnosis present

## 2014-02-06 DIAGNOSIS — M810 Age-related osteoporosis without current pathological fracture: Secondary | ICD-10-CM | POA: Diagnosis present

## 2014-02-06 DIAGNOSIS — I10 Essential (primary) hypertension: Secondary | ICD-10-CM

## 2014-02-06 DIAGNOSIS — F259 Schizoaffective disorder, unspecified: Secondary | ICD-10-CM | POA: Diagnosis present

## 2014-02-06 DIAGNOSIS — F410 Panic disorder [episodic paroxysmal anxiety] without agoraphobia: Secondary | ICD-10-CM | POA: Diagnosis present

## 2014-02-06 DIAGNOSIS — F17200 Nicotine dependence, unspecified, uncomplicated: Secondary | ICD-10-CM | POA: Diagnosis present

## 2014-02-06 DIAGNOSIS — R0602 Shortness of breath: Secondary | ICD-10-CM | POA: Diagnosis present

## 2014-02-06 DIAGNOSIS — F319 Bipolar disorder, unspecified: Secondary | ICD-10-CM | POA: Diagnosis present

## 2014-02-06 HISTORY — DX: Headache: R51

## 2014-02-06 LAB — CBC WITH DIFFERENTIAL/PLATELET
BASOS ABS: 0 10*3/uL (ref 0.0–0.1)
Basophils Relative: 0 % (ref 0–1)
Eosinophils Absolute: 0.3 10*3/uL (ref 0.0–0.7)
Eosinophils Relative: 2 % (ref 0–5)
HEMATOCRIT: 36.3 % (ref 36.0–46.0)
Hemoglobin: 11.5 g/dL — ABNORMAL LOW (ref 12.0–15.0)
LYMPHS PCT: 12 % (ref 12–46)
Lymphs Abs: 1.7 10*3/uL (ref 0.7–4.0)
MCH: 32.8 pg (ref 26.0–34.0)
MCHC: 31.7 g/dL (ref 30.0–36.0)
MCV: 103.4 fL — ABNORMAL HIGH (ref 78.0–100.0)
MONOS PCT: 7 % (ref 3–12)
Monocytes Absolute: 1 10*3/uL (ref 0.1–1.0)
NEUTROS PCT: 79 % — AB (ref 43–77)
Neutro Abs: 10.9 10*3/uL — ABNORMAL HIGH (ref 1.7–7.7)
Platelets: 235 10*3/uL (ref 150–400)
RBC: 3.51 MIL/uL — ABNORMAL LOW (ref 3.87–5.11)
RDW: 14.3 % (ref 11.5–15.5)
WBC MORPHOLOGY: INCREASED
WBC: 13.9 10*3/uL — AB (ref 4.0–10.5)

## 2014-02-06 LAB — BASIC METABOLIC PANEL
ANION GAP: 8 (ref 5–15)
BUN: 8 mg/dL (ref 6–23)
CO2: 34 mEq/L — ABNORMAL HIGH (ref 19–32)
Calcium: 8.6 mg/dL (ref 8.4–10.5)
Chloride: 100 mEq/L (ref 96–112)
Creatinine, Ser: 0.77 mg/dL (ref 0.50–1.10)
GLUCOSE: 106 mg/dL — AB (ref 70–99)
POTASSIUM: 3.6 meq/L — AB (ref 3.7–5.3)
SODIUM: 142 meq/L (ref 137–147)

## 2014-02-06 LAB — TROPONIN I

## 2014-02-06 MED ORDER — VITAMIN C 500 MG PO TABS
500.0000 mg | ORAL_TABLET | Freq: Every day | ORAL | Status: DC
  Administered 2014-02-07 – 2014-02-15 (×9): 500 mg via ORAL
  Filled 2014-02-06 (×9): qty 1

## 2014-02-06 MED ORDER — METHYLPHENIDATE HCL 5 MG PO TABS
20.0000 mg | ORAL_TABLET | ORAL | Status: DC
  Administered 2014-02-06 – 2014-02-15 (×27): 20 mg via ORAL
  Filled 2014-02-06 (×28): qty 4

## 2014-02-06 MED ORDER — AZITHROMYCIN 250 MG PO TABS
500.0000 mg | ORAL_TABLET | Freq: Once | ORAL | Status: AC
  Administered 2014-02-06: 500 mg via ORAL
  Filled 2014-02-06: qty 2

## 2014-02-06 MED ORDER — KETOROLAC TROMETHAMINE 30 MG/ML IJ SOLN
30.0000 mg | Freq: Once | INTRAMUSCULAR | Status: AC
  Administered 2014-02-06: 30 mg via INTRAVENOUS
  Filled 2014-02-06: qty 1

## 2014-02-06 MED ORDER — VITAMIN B-12 1000 MCG PO TABS
1000.0000 ug | ORAL_TABLET | Freq: Every day | ORAL | Status: DC
  Administered 2014-02-07 – 2014-02-15 (×9): 1000 ug via ORAL
  Filled 2014-02-06 (×11): qty 1

## 2014-02-06 MED ORDER — DEXTROSE 5 % IV SOLN
500.0000 mg | INTRAVENOUS | Status: DC
  Administered 2014-02-07 – 2014-02-08 (×2): 500 mg via INTRAVENOUS
  Filled 2014-02-06 (×4): qty 500

## 2014-02-06 MED ORDER — PREGABALIN 50 MG PO CAPS
50.0000 mg | ORAL_CAPSULE | Freq: Every day | ORAL | Status: DC
  Administered 2014-02-06 – 2014-02-15 (×10): 50 mg via ORAL
  Filled 2014-02-06 (×10): qty 1

## 2014-02-06 MED ORDER — AMLODIPINE BESYLATE 5 MG PO TABS
5.0000 mg | ORAL_TABLET | Freq: Every day | ORAL | Status: DC
  Administered 2014-02-07 – 2014-02-15 (×9): 5 mg via ORAL
  Filled 2014-02-06 (×9): qty 1

## 2014-02-06 MED ORDER — FESOTERODINE FUMARATE ER 4 MG PO TB24
4.0000 mg | ORAL_TABLET | Freq: Every day | ORAL | Status: DC
  Administered 2014-02-07 – 2014-02-15 (×9): 4 mg via ORAL
  Filled 2014-02-06 (×12): qty 1

## 2014-02-06 MED ORDER — ATORVASTATIN CALCIUM 20 MG PO TABS
20.0000 mg | ORAL_TABLET | Freq: Every day | ORAL | Status: DC
  Administered 2014-02-06 – 2014-02-15 (×10): 20 mg via ORAL
  Filled 2014-02-06 (×10): qty 1

## 2014-02-06 MED ORDER — DEXTROSE 5 % IV SOLN
1.0000 g | INTRAVENOUS | Status: AC
  Administered 2014-02-07 – 2014-02-13 (×7): 1 g via INTRAVENOUS
  Filled 2014-02-06 (×7): qty 10

## 2014-02-06 MED ORDER — ALBUTEROL SULFATE (2.5 MG/3ML) 0.083% IN NEBU
2.5000 mg | INHALATION_SOLUTION | Freq: Four times a day (QID) | RESPIRATORY_TRACT | Status: DC
  Administered 2014-02-06 – 2014-02-09 (×10): 2.5 mg via RESPIRATORY_TRACT
  Filled 2014-02-06 (×9): qty 3

## 2014-02-06 MED ORDER — BUDESONIDE-FORMOTEROL FUMARATE 160-4.5 MCG/ACT IN AERO
2.0000 | INHALATION_SPRAY | Freq: Two times a day (BID) | RESPIRATORY_TRACT | Status: DC
  Administered 2014-02-06 – 2014-02-15 (×19): 2 via RESPIRATORY_TRACT
  Filled 2014-02-06: qty 6

## 2014-02-06 MED ORDER — PNEUMOCOCCAL VAC POLYVALENT 25 MCG/0.5ML IJ INJ
0.5000 mL | INJECTION | INTRAMUSCULAR | Status: AC
  Administered 2014-02-07: 0.5 mL via INTRAMUSCULAR
  Filled 2014-02-06: qty 0.5

## 2014-02-06 MED ORDER — DIAZEPAM 5 MG PO TABS
5.0000 mg | ORAL_TABLET | Freq: Three times a day (TID) | ORAL | Status: DC
  Administered 2014-02-06 – 2014-02-15 (×28): 5 mg via ORAL
  Filled 2014-02-06 (×28): qty 1

## 2014-02-06 MED ORDER — METOCLOPRAMIDE HCL 5 MG/ML IJ SOLN
10.0000 mg | Freq: Once | INTRAMUSCULAR | Status: AC
  Administered 2014-02-06: 10 mg via INTRAVENOUS
  Filled 2014-02-06: qty 2

## 2014-02-06 MED ORDER — HEPARIN SODIUM (PORCINE) 5000 UNIT/ML IJ SOLN
5000.0000 [IU] | Freq: Three times a day (TID) | INTRAMUSCULAR | Status: DC
  Administered 2014-02-06 – 2014-02-15 (×26): 5000 [IU] via SUBCUTANEOUS
  Filled 2014-02-06 (×27): qty 1

## 2014-02-06 MED ORDER — BUDESONIDE-FORMOTEROL FUMARATE 160-4.5 MCG/ACT IN AERO
INHALATION_SPRAY | RESPIRATORY_TRACT | Status: AC
  Filled 2014-02-06: qty 6

## 2014-02-06 MED ORDER — ALBUTEROL (5 MG/ML) CONTINUOUS INHALATION SOLN
10.0000 mg/h | INHALATION_SOLUTION | Freq: Once | RESPIRATORY_TRACT | Status: AC
  Administered 2014-02-06: 10 mg/h via RESPIRATORY_TRACT
  Filled 2014-02-06: qty 20

## 2014-02-06 MED ORDER — CALCIUM CARBONATE-VITAMIN D 500-200 MG-UNIT PO TABS
1.0000 | ORAL_TABLET | ORAL | Status: DC
  Administered 2014-02-06 – 2014-02-15 (×19): 1 via ORAL
  Filled 2014-02-06 (×19): qty 1

## 2014-02-06 MED ORDER — TRAZODONE HCL 50 MG PO TABS
100.0000 mg | ORAL_TABLET | Freq: Every day | ORAL | Status: DC
  Administered 2014-02-06 – 2014-02-15 (×10): 100 mg via ORAL
  Filled 2014-02-06 (×10): qty 2

## 2014-02-06 MED ORDER — INFLUENZA VAC SPLIT QUAD 0.5 ML IM SUSY
0.5000 mL | PREFILLED_SYRINGE | INTRAMUSCULAR | Status: AC
  Administered 2014-02-07: 0.5 mL via INTRAMUSCULAR
  Filled 2014-02-06: qty 0.5

## 2014-02-06 MED ORDER — OXYCODONE-ACETAMINOPHEN 5-325 MG PO TABS
1.0000 | ORAL_TABLET | Freq: Four times a day (QID) | ORAL | Status: DC
  Administered 2014-02-06 – 2014-02-15 (×36): 1 via ORAL
  Filled 2014-02-06 (×38): qty 1

## 2014-02-06 MED ORDER — OLANZAPINE 5 MG PO TABS
10.0000 mg | ORAL_TABLET | Freq: Every day | ORAL | Status: DC
  Administered 2014-02-06 – 2014-02-15 (×10): 10 mg via ORAL
  Filled 2014-02-06 (×10): qty 2

## 2014-02-06 MED ORDER — SODIUM CHLORIDE 0.9 % IV BOLUS (SEPSIS)
1000.0000 mL | Freq: Once | INTRAVENOUS | Status: AC
  Administered 2014-02-06: 1000 mL via INTRAVENOUS

## 2014-02-06 MED ORDER — DIPHENHYDRAMINE HCL 50 MG/ML IJ SOLN
25.0000 mg | Freq: Once | INTRAMUSCULAR | Status: AC
  Administered 2014-02-06: 25 mg via INTRAVENOUS
  Filled 2014-02-06: qty 1

## 2014-02-06 MED ORDER — ALBUTEROL SULFATE (2.5 MG/3ML) 0.083% IN NEBU
2.5000 mg | INHALATION_SOLUTION | Freq: Once | RESPIRATORY_TRACT | Status: AC
  Administered 2014-02-06: 2.5 mg via RESPIRATORY_TRACT
  Filled 2014-02-06: qty 3

## 2014-02-06 MED ORDER — DIVALPROEX SODIUM ER 500 MG PO TB24
1000.0000 mg | ORAL_TABLET | Freq: Every day | ORAL | Status: DC
  Administered 2014-02-06 – 2014-02-15 (×10): 1000 mg via ORAL
  Filled 2014-02-06 (×10): qty 2

## 2014-02-06 MED ORDER — PAROXETINE HCL 20 MG PO TABS
40.0000 mg | ORAL_TABLET | Freq: Every day | ORAL | Status: DC
  Administered 2014-02-07 – 2014-02-15 (×9): 40 mg via ORAL
  Filled 2014-02-06 (×9): qty 2

## 2014-02-06 MED ORDER — IPRATROPIUM-ALBUTEROL 0.5-2.5 (3) MG/3ML IN SOLN
3.0000 mL | Freq: Once | RESPIRATORY_TRACT | Status: AC
Start: 2014-02-06 — End: 2014-02-06
  Administered 2014-02-06: 3 mL via RESPIRATORY_TRACT
  Filled 2014-02-06: qty 3

## 2014-02-06 MED ORDER — DEXTROSE 5 % IV SOLN
1.0000 g | Freq: Once | INTRAVENOUS | Status: AC
  Administered 2014-02-06: 1 g via INTRAVENOUS
  Filled 2014-02-06: qty 10

## 2014-02-06 MED ORDER — ALBUTEROL SULFATE (2.5 MG/3ML) 0.083% IN NEBU
2.5000 mg | INHALATION_SOLUTION | RESPIRATORY_TRACT | Status: DC | PRN
  Filled 2014-02-06: qty 3

## 2014-02-06 MED ORDER — METHYLPREDNISOLONE SODIUM SUCC 125 MG IJ SOLR
125.0000 mg | Freq: Once | INTRAMUSCULAR | Status: AC
  Administered 2014-02-06: 125 mg via INTRAVENOUS
  Filled 2014-02-06: qty 2

## 2014-02-06 NOTE — H&P (Signed)
[facility] History and Physical  Melinda Wood RUE:[id number] DOB: 1955/08/28 DOA: 02/06/2014  Referring physician: ER PCP: Isabella Stalling, MD   Chief Complaint: Dyspnea, cough productive of green sputum.  HPI: Melinda Wood is a 58 y.o. female  This is a 58 year old lady who presents with a 3 to four-day history of dyspnea and cough productive of green sputum. She continues to smoke 2 packs of cigarettes per day. When she presented to the emergency room, she was hypoxemic on room air. In fact, she tells me that she is normally dyspneic when she is well on exertion. I suspect she has COPD and probably requires oxygen at home. She is now being admitted for pneumonia that was seen on the left lower lobe.   Review of Systems:  Constitutional:  No weight loss, night sweats, Fevers, chills, fatigue.  HEENT:  No headaches, Difficulty swallowing,Tooth/dental problems,Sore throat,  No sneezing, itching, ear ache, nasal congestion, post nasal drip,  Cardio-vascular:  No chest pain, Orthopnea, PND, swelling in lower extremities, anasarca, dizziness, palpitations  GI:  No heartburn, indigestion, abdominal pain, nausea, vomiting, diarrhea, change in bowel habits, loss of appetite   Skin:  no rash or lesions.  GU:  no dysuria, change in color of urine, no urgency or frequency. No flank pain.  Musculoskeletal:  No joint pain or swelling. No decreased range of motion. No back pain.  Psych:  No change in mood or affect. No depression or anxiety. No memory loss.   Past Medical History  Diagnosis Date  . High cholesterol   . Asthma   . Hypertension   . Bipolar 1 disorder   . Urinary incontinence   . Anxiety   . Panic attacks   . JYNWGNFA(213.0)    Past Surgical History  Procedure Laterality Date  . Shoulder surgery    . Appendectomy     Social History:  reports that she quit smoking 4 days ago. Her smoking use included Cigarettes. She has a 5 pack-year smoking history.  She has never used smokeless tobacco. She reports that she does not drink alcohol or use illicit drugs.  Allergies  Allergen Reactions  . Arthrotec [Diclofenac-Misoprostol] Hives  . Sulfonamide Derivatives Hives    No family history on file.   Prior to Admission medications   Medication Sig Start Date End Date Taking? Authorizing Provider  amLODipine (NORVASC) 5 MG tablet Take 1 tablet (5 mg total) by mouth daily. For blood pressure control. 02/16/12  Yes Curlene Labrum Readling, MD  ATELVIA 35 MG TBEC Take 35 mg by mouth every 7 (seven) days. 07/04/13  Yes Historical Provider, MD  atorvastatin (LIPITOR) 20 MG tablet Take 1 tablet (20 mg total) by mouth at bedtime. For cholesterol control. 02/16/12  Yes Curlene Labrum Readling, MD  budesonide-formoterol (SYMBICORT) 160-4.5 MCG/ACT inhaler Inhale 2 puffs into the lungs 2 (two) times daily.   Yes Historical Provider, MD  calcium-vitamin D (OSCAL WITH D) 500-200 MG-UNIT per tablet Take 1 tablet by mouth 2 (two) times daily in the am and at bedtime.. For bone health. 02/16/12  Yes Curlene Labrum Readling, MD  diazepam (VALIUM) 5 MG tablet Take 5 mg by mouth 3 (three) times daily. 07/24/13  Yes Historical Provider, MD  divalproex (DEPAKOTE ER) 500 MG 24 hr tablet Take 2 tablets (1,000 mg total) by mouth at bedtime. For mood stabilization. 02/16/12  Yes Curlene Labrum Readling, MD  fesoterodine (TOVIAZ) 4 MG TB24 Take 1 tablet (4 mg total) by mouth daily. Urinary incontinence  02/16/12  Yes Curlene Labrum Readling, MD  methylphenidate (RITALIN) 20 MG tablet Take 20 mg by mouth 3 (three) times daily. 08/06/13  Yes Historical Provider, MD  OLANZapine (ZYPREXA) 10 MG tablet Take 1 tablet (10 mg total) by mouth at bedtime. For mood stabilization, sleep and clarity of thoughts. 02/16/12  Yes Ronny Bacon, MD  oxyCODONE-acetaminophen (PERCOCET/ROXICET) 5-325 MG per tablet Take 1 tablet by mouth 4 (four) times daily. 08/09/13  Yes Historical Provider, MD  PARoxetine (PAXIL) 40 MG tablet Take 1  tablet (40 mg total) by mouth daily. For depression and anxiety. 02/16/12  Yes Curlene Labrum Readling, MD  predniSONE (DELTASONE) 50 MG tablet Take 1 tablet (50 mg total) by mouth daily. 02/01/14  Yes Dione Booze, MD  pregabalin (LYRICA) 50 MG capsule Take 50 mg by mouth at bedtime. For chronic pain. 02/16/12  Yes Ronny Bacon, MD  traZODone (DESYREL) 100 MG tablet Take 1 tablet (100 mg total) by mouth at bedtime. For sleep. 02/16/12  Yes Curlene Labrum Readling, MD  vitamin B-12 1000 MCG tablet Take 1 tablet (1,000 mcg total) by mouth daily. For nutritional supplementation. 02/16/12  Yes Curlene Labrum Readling, MD  vitamin C (VITAMIN C) 500 MG tablet Take 1 tablet (500 mg total) by mouth daily. For nutritional supplementation. 02/16/12  Yes Ronny Bacon, MD   Physical Exam: Filed Vitals:   02/06/14 1447 02/06/14 1500 02/06/14 1630 02/06/14 1742  BP: 112/66 140/109 100/56 103/62  Pulse: 101 98 92 95  Temp: 98.2 F (36.8 C)   98.1 F (36.7 C)  TempSrc: Oral   Oral  Resp: Height:      Weight:      SpO2: 93% 92% 95% 96%    Wt Readings from Last 3 Encounters:  02/06/14 61.236 kg (135 lb)  01/31/14 61.236 kg (135 lb)  08/18/13 68.947 kg (152 lb)    General:  Appears calm and comfortable. She does not appear to be in respiratory distress. There is no peripheral central cyanosis. She does not look toxic or septic clinically. Eyes: PERRL, normal lids, irises & conjunctiva ENT: grossly normal hearing, lips & tongue Neck: no LAD, masses or thyromegaly Cardiovascular: RRR, no m/r/g. No LE edema. Telemetry: SR, no arrhythmias  Respiratory: Crackles at the left mid and lower zones. There is no bronchial breathing. There is no wheezing whatsoever at the present time. Abdomen: soft, ntnd Skin: no rash or induration seen on limited exam Musculoskeletal: grossly normal tone BUE/BLE Psychiatric: grossly normal mood and affect, speech fluent and appropriate Neurologic: grossly non-focal.           Labs on Admission:  Basic Metabolic Panel:  Recent Labs Lab 02/06/14 1358  NA 142  K 3.6*  CL 100  CO2 34*  GLUCOSE 106*  BUN 8  CREATININE 0.77  CALCIUM 8.6      CBC:  Recent Labs Lab 02/06/14 1358  WBC 13.9*  NEUTROABS 10.9*  HGB 11.5*  HCT 36.3  MCV 103.4*  PLT 235   Cardiac Enzymes:  Recent Labs Lab 02/06/14 1358  TROPONINI <0.30       Radiological Exams on Admission: Dg Chest 2 View  02/06/2014   CLINICAL DATA:  Cough for 1-2 weeks with hypoxia ; history of asthma and tobacco use  EXAM: CHEST  2 VIEW  COMPARISON:  PA and lateral chest x-ray of February 01, 2014  FINDINGS: The lungs are adequately inflated. There is increased density in the left lower  lobe consistent with atelectasis or pneumonia. The heart and pulmonary vascularity are normal. The mediastinum is normal in width. There is no pleural effusion. The bony thorax is unremarkable.  IMPRESSION: Left lower lobe atelectasis or pneumonia. There is no evidence of CHF.   Electronically Signed   By: David  [place]   On: 02/06/2014 15:59    EKG: Independently reviewed. Normal sinus rhythm without any acute ST-T wave changes.  Assessment/Plan   1. Community-acquired pneumonia. 2. COPD. 3. Hypertension. 4. Schizoaffective disorder, stable. 5. Ongoing tobacco abuse. 6. Macrocytosis  Plan: 1. Admit to medical floor. 2. Intravenous antibiotics for pneumonia. 3. Supplemental oxygen. I suspect she will need home oxygen. 4. Check TSH, B12 and folate levels for the macrocytosis. She denies any use of alcohol.  Further recommendations will depend on patient's hospital progress.  Code Status: Full code.  DVT Prophylaxis: Heparin.  Family Communication: I discussed the plan with patient at the bedside there   Disposition Plan: Home when medically stable.  Time spent: 60 minutes.  Wilson Singer [facility] Pager [phone].

## 2014-02-06 NOTE — ED Notes (Addendum)
Pt oxygen saturations currently 89%. Pt placed on 2L of oxygen per pt request.

## 2014-02-06 NOTE — ED Notes (Signed)
Spoke to Dr. Karilyn Cota on current condition of pt regarding increasing weakness, current vital signs, and appropriate med-surg unit assignment. Dr. Karilyn Cota advised to follow med-surg unit. Will continue to monitor pt.

## 2014-02-06 NOTE — ED Notes (Signed)
Oxygen saturation with ambulation between 82%-84%. Pt reported feeling weak when ambulating.

## 2014-02-06 NOTE — ED Provider Notes (Addendum)
TIME SEEN: 1:28 PM  CHIEF COMPLAINT: Cough, shortness of breath, generalized weakness  HPI: Patient is a 58 y.o. F with history of hypertension, hyperlipidemia, asthma, COPD who is not on oxygen at home who presents to the emergency department with several days of coughing, subjective fevers and chills, shortness of breath. She denies any chest pain or chest discomfort. No lower extremity swelling or pain. No history of PE or DVT, CAD, CHF. She was seen here on 01/31/14 and had a negative chest x-ray. She was discharged on prednisone and albuterol. She states that her symptoms have progressively gotten worse. Patient is also complaining of a migraine headache. States she's had similar headaches before. No history of head injury. No neck pain or neck stiffness. No numbness, tingling or focal weakness. Worse with light and sounds. Better with rest. She is requesting Percocet for her headache.  ROS: See HPI Constitutional: Subjective fever  Eyes: no drainage  ENT: no runny nose   Cardiovascular:  no chest pain  Resp: SOB  GI: no vomiting GU: no dysuria Integumentary: no rash  Allergy: no hives  Musculoskeletal: no leg swelling  Neurological: no slurred speech ROS otherwise negative  PAST MEDICAL HISTORY/PAST SURGICAL HISTORY:  Past Medical History  Diagnosis Date  . High cholesterol   . Asthma   . Hypertension   . Bipolar 1 disorder   . Urinary incontinence   . Anxiety   . Panic attacks     MEDICATIONS:  Prior to Admission medications   Medication Sig Start Date End Date Taking? Authorizing Provider  amLODipine (NORVASC) 5 MG tablet Take 1 tablet (5 mg total) by mouth daily. For blood pressure control. 02/16/12  Yes Curlene Labrum Readling, MD  ATELVIA 35 MG TBEC Take 35 mg by mouth every 7 (seven) days. 07/04/13  Yes Historical Provider, MD  atorvastatin (LIPITOR) 20 MG tablet Take 1 tablet (20 mg total) by mouth at bedtime. For cholesterol control. 02/16/12  Yes Curlene Labrum Readling, MD   budesonide-formoterol (SYMBICORT) 160-4.5 MCG/ACT inhaler Inhale 2 puffs into the lungs 2 (two) times daily.   Yes Historical Provider, MD  calcium-vitamin D (OSCAL WITH D) 500-200 MG-UNIT per tablet Take 1 tablet by mouth 2 (two) times daily in the am and at bedtime.. For bone health. 02/16/12  Yes Curlene Labrum Readling, MD  diazepam (VALIUM) 5 MG tablet Take 5 mg by mouth 3 (three) times daily. 07/24/13  Yes Historical Provider, MD  divalproex (DEPAKOTE ER) 500 MG 24 hr tablet Take 2 tablets (1,000 mg total) by mouth at bedtime. For mood stabilization. 02/16/12  Yes Curlene Labrum Readling, MD  fesoterodine (TOVIAZ) 4 MG TB24 Take 1 tablet (4 mg total) by mouth daily. Urinary incontinence 02/16/12  Yes Ronny Bacon, MD  methylphenidate (RITALIN) 20 MG tablet Take 20 mg by mouth 3 (three) times daily. 08/06/13  Yes Historical Provider, MD  OLANZapine (ZYPREXA) 10 MG tablet Take 1 tablet (10 mg total) by mouth at bedtime. For mood stabilization, sleep and clarity of thoughts. 02/16/12  Yes Ronny Bacon, MD  oxyCODONE-acetaminophen (PERCOCET/ROXICET) 5-325 MG per tablet Take 1 tablet by mouth 4 (four) times daily. 08/09/13  Yes Historical Provider, MD  PARoxetine (PAXIL) 40 MG tablet Take 1 tablet (40 mg total) by mouth daily. For depression and anxiety. 02/16/12  Yes Curlene Labrum Readling, MD  predniSONE (DELTASONE) 50 MG tablet Take 1 tablet (50 mg total) by mouth daily. 02/01/14  Yes Dione Booze, MD  pregabalin (LYRICA) 50 MG capsule Take  50 mg by mouth at bedtime. For chronic pain. 02/16/12  Yes Ronny Bacon, MD  traZODone (DESYREL) 100 MG tablet Take 1 tablet (100 mg total) by mouth at bedtime. For sleep. 02/16/12  Yes Curlene Labrum Readling, MD  vitamin B-12 1000 MCG tablet Take 1 tablet (1,000 mcg total) by mouth daily. For nutritional supplementation. 02/16/12  Yes Curlene Labrum Readling, MD  vitamin C (VITAMIN C) 500 MG tablet Take 1 tablet (500 mg total) by mouth daily. For nutritional supplementation. 02/16/12  Yes Ronny Bacon, MD    ALLERGIES:  Allergies  Allergen Reactions  . Arthrotec [Diclofenac-Misoprostol] Hives  . Sulfonamide Derivatives Hives    SOCIAL HISTORY:  History  Substance Use Topics  . Smoking status: Former Smoker -- 0.50 packs/day for 10 years    Types: Cigarettes    Quit date: 02/02/2014  . Smokeless tobacco: Never Used  . Alcohol Use: No    FAMILY HISTORY: No family history on file.  EXAM: BP 95/60  Pulse 82  Temp(Src) 98.2 F (36.8 C) (Oral)  Resp 16  Ht  (1.626 m)  Wt 135 lb (61.236 kg)  BMI 23.16 kg/m2  SpO2 95% CONSTITUTIONAL: Alert and oriented and responds appropriately to questions. Well-appearing; well-nourished HEAD: Normocephalic EYES: Conjunctivae clear, PERRL ENT: normal nose; no rhinorrhea; moist mucous membranes; pharynx without lesions noted NECK: Supple, no meningismus, no LAD  CARD: RRR; S1 and S2 appreciated; no murmurs, no clicks, no rubs, no gallops RESP: Normal chest excursion without splinting or tachypnea; patient does have some mild expiratory wheezing diffusely and is diminished at her bases, no rhonchi or rales, no increased work of breathing, no respiratory distress ABD/GI: Normal bowel sounds; non-distended; soft, non-tender, no rebound, no guarding BACK:  The back appears normal and is non-tender to palpation, there is no CVA tenderness EXT: Normal ROM in all joints; non-tender to palpation; no edema; normal capillary refill; no cyanosis, no calf tenderness or swelling    SKIN: Normal color for age and race; warm NEURO: Moves all extremities equally, sensation to light touch intact diffusely, cranial nerves II through XII intact PSYCH: The patient's mood and manner are appropriate. Grooming and personal hygiene are appropriate.  MEDICAL DECISION MAKING: Patient here with cough, subjective fevers and shortness of breath. She does have a history of asthma/COPD. Will give breathing treatment, Solu-Medrol. Concern for possible  pneumonia. Will check labs including troponin to make sure this is not her anginal equivalent. EKG shows no ischemic changes. We'll repeat chest x-ray.    She is also complaining of a migraine headache. We'll give Toradol, Reglan, Benadryl and IV fluids. I am not concerned for meningitis, encephalitis, infarct, hemorrhage or cavernous sinus thrombosis. She is neurologically intact reports she's had similar headaches before.  ED PROGRESS: Patient is a leukocytosis of 13.9. Chest x-ray shows a left lower lobe infiltrate. Have given ceftriaxone and azithromycin. She is hemodynamically stable. She has received 2 DuoNeb treatments, albuterol. When she relates her oxygen saturation drops to 82% on room air. She is not on oxygen chronically. Will place back on oxygen and admitted to the hospitalist for hypoxia, new oxygen requirement and community-acquired pneumonia.    CRITICAL CARE Performed by: Raelyn Number   Total critical care time: 35 minutes  Critical care time was exclusive of separately billable procedures and treating other patients.  Critical care was necessary to treat or prevent imminent or life-threatening deterioration. Repeat examination, continuous albuterol, IV antibiotics, admission for COPD exacerbation and community-acquired  pneumonia.  Critical care was time spent personally by me on the following activities: development of treatment plan with patient and/or surrogate as well as nursing, discussions with consultants, evaluation of patient's response to treatment, examination of patient, obtaining history from patient or surrogate, ordering and performing treatments and interventions, ordering and review of laboratory studies, ordering and review of radiographic studies, pulse oximetry and re-evaluation of patient's condition.    EKG Interpretation  Date/Time:  Wednesday February 06 2014 13:14:37 EDT Ventricular Rate:  78 PR Interval:  137 QRS Duration: 84 QT  Interval:  423 QTC Calculation: 482 R Axis:   10 Text Interpretation:  Sinus rhythm Low voltage, precordial leads Borderline T abnormalities, anterior leads Confirmed by WARD,  DO, KRISTEN [phone]) on 02/06/2014 3:34:09 PM           Layla Maw Ward, DO 02/06/14 1626  Kristen N Ward, DO 02/06/14 1626

## 2014-02-06 NOTE — ED Notes (Signed)
Cough, wheezing, sob, headache x 4 days.

## 2014-02-07 DIAGNOSIS — J45901 Unspecified asthma with (acute) exacerbation: Secondary | ICD-10-CM | POA: Diagnosis not present

## 2014-02-07 DIAGNOSIS — J441 Chronic obstructive pulmonary disease with (acute) exacerbation: Secondary | ICD-10-CM | POA: Diagnosis not present

## 2014-02-07 LAB — CBC
HCT: 35.1 % — ABNORMAL LOW (ref 36.0–46.0)
Hemoglobin: 11.4 g/dL — ABNORMAL LOW (ref 12.0–15.0)
MCH: 32.6 pg (ref 26.0–34.0)
MCHC: 32.5 g/dL (ref 30.0–36.0)
MCV: 100.3 fL — ABNORMAL HIGH (ref 78.0–100.0)
Platelets: 223 10*3/uL (ref 150–400)
RBC: 3.5 MIL/uL — AB (ref 3.87–5.11)
RDW: 14.2 % (ref 11.5–15.5)
WBC: 11.9 10*3/uL — ABNORMAL HIGH (ref 4.0–10.5)

## 2014-02-07 LAB — COMPREHENSIVE METABOLIC PANEL
ALT: <5 U/L (ref 0–35)
ANION GAP: 8 (ref 5–15)
AST: 7 U/L (ref 0–37)
Albumin: 2.5 g/dL — ABNORMAL LOW (ref 3.5–5.2)
Alkaline Phosphatase: 61 U/L (ref 39–117)
BUN: 12 mg/dL (ref 6–23)
CO2: 33 mEq/L — ABNORMAL HIGH (ref 19–32)
Calcium: 8.7 mg/dL (ref 8.4–10.5)
Chloride: 103 mEq/L (ref 96–112)
Creatinine, Ser: 0.63 mg/dL (ref 0.50–1.10)
GFR calc Af Amer: >90 mL/min (ref >90)
GFR calc non Af Amer: >90 mL/min (ref >90)
Glucose, Bld: 137 mg/dL — ABNORMAL HIGH (ref 70–99)
POTASSIUM: 4.5 meq/L (ref 3.7–5.3)
Sodium: 144 mEq/L (ref 137–147)
Total Bilirubin: <0.2 mg/dL — ABNORMAL LOW (ref 0.3–1.2)
Total Protein: 5.7 g/dL — ABNORMAL LOW (ref 6.0–8.3)

## 2014-02-07 LAB — HIV ANTIBODY (ROUTINE TESTING W REFLEX): HIV 1&2 Ab, 4th Generation: NONREACTIVE

## 2014-02-07 LAB — STREP PNEUMONIAE URINARY ANTIGEN: Strep Pneumo Urinary Antigen: NEGATIVE

## 2014-02-07 LAB — VITAMIN B12: Vitamin B-12: 793 pg/mL (ref 211–911)

## 2014-02-07 LAB — TSH: TSH: 1.36 u[IU]/mL (ref 0.350–4.500)

## 2014-02-07 MED ORDER — NICOTINE 21 MG/24HR TD PT24
21.0000 mg | MEDICATED_PATCH | Freq: Every day | TRANSDERMAL | Status: DC
  Administered 2014-02-07 – 2014-02-15 (×9): 21 mg via TRANSDERMAL
  Filled 2014-02-07 (×9): qty 1

## 2014-02-07 NOTE — Progress Notes (Signed)
Patient admitted with left lower lobe pneumonia severe COPD 2 pack per day nicotine abuse hypertension osteoporosis bipolar and schizoaffective disorder with superimposed anxiety disorder. Melinda Wood ZOX:[id number] DOB: April 02, 1956 DOA: 02/06/2014 PCP: Isabella Stalling, MD             Physical Exam: Blood pressure 107/59, pulse 75, temperature 98.5 F (36.9 C), temperature source Oral, resp. rate 20, height  (1.626 m), weight 135 lb (61.236 kg), SpO2 94.00%. lungs show coarse bilateral rhonchi bilateral mild end expiratory wheezes no rales appreciable. Heart regular rhythm no S3-S4 no heaves chills or rubs. Abdomen soft nontender bowel sounds normoactive.   Investigations:  Recent Results (from the past 240 hour(s))  CULTURE, BLOOD (ROUTINE X 2)     Status: None   Collection Time    02/06/14  8:08 PM      Result Value Ref Range Status   Specimen Description BLOOD RIGHT HAND   Final   Special Requests BOTTLES DRAWN AEROBIC AND ANAEROBIC 6CC EACH   Final   Culture NO GROWTH 1 DAY   Final   Report Status PENDING   Incomplete  CULTURE, BLOOD (ROUTINE X 2)     Status: None   Collection Time    02/06/14  8:08 PM      Result Value Ref Range Status   Specimen Description Blood   Final   Special Requests Normal   Final   Culture NO GROWTH 1 DAY   Final   Report Status PENDING   Incomplete     Basic Metabolic Panel:  Recent Labs  81/19/14 1358 02/07/14 0502  NA 142 144  K 3.6* 4.5  CL 100 103  CO2 34* 33*  GLUCOSE 106* 137*  BUN 8 12  CREATININE 0.77 0.63  CALCIUM 8.6 8.7   Liver Function Tests:  Recent Labs  02/07/14 0502  AST 7  ALT <5  ALKPHOS 61  BILITOT <0.2*  PROT 5.7*  ALBUMIN 2.5*     CBC:  Recent Labs  02/06/14 1358 02/07/14 0502  WBC 13.9* 11.9*  NEUTROABS 10.9*  --   HGB 11.5* 11.4*  HCT 36.3 35.1*  MCV 103.4* 100.3*  PLT 235 223    Dg Chest 2 View  02/06/2014   CLINICAL DATA:  Cough for 1-2 weeks with hypoxia ; history of  asthma and tobacco use  EXAM: CHEST  2 VIEW  COMPARISON:  PA and lateral chest x-ray of February 01, 2014  FINDINGS: The lungs are adequately inflated. There is increased density in the left lower lobe consistent with atelectasis or pneumonia. The heart and pulmonary vascularity are normal. The mediastinum is normal in width. There is no pleural effusion. The bony thorax is unremarkable.  IMPRESSION: Left lower lobe atelectasis or pneumonia. There is no evidence of CHF.   Electronically Signed   By: David  [place]   On: 02/06/2014 15:59      Medications: I have reviewed the patient's current medications.  Impression: Left lower lobe pneumonia. Severe COPD 2 pack per day nicotine abuse hypertension osteoporosis and schizoaffective disorder bipolar disorder anxiety disorder. Active Problems:   Schizoaffective disorder, bipolar type   HTN (hypertension)   CAP (community acquired pneumonia)   COPD exacerbation   Tobacco use disorder   Community acquired pneumonia     Plan: IV Zithromax 500 mg daily. IV Rocephin 1 g every 24 hours albuterol nebulizer unit dose every 4 hours while awake. Nicotine patch 21 mg daily.   Consultants: None.  Procedures none.   Antibiotics IV Rocephin and Zithromax as above.                  Code Status: Full code.   Family Communication:  None.  Disposition Plan as above.  Time spent: 30 minutes.   LOS: 1 day   Walden Statz M   02/07/2014, 1:58 PM

## 2014-02-07 NOTE — Care Management Note (Signed)
    Page 1 of 2   02/15/2014     2:15:56 PM CARE MANAGEMENT NOTE 02/15/2014  Patient:  Melinda Wood, Melinda Wood   Account Number:  [id number]  Date Initiated:  02/07/2014  Documentation initiated by:  Anibal Henderson  Subjective/Objective Assessment:   Admitted with CAP. Pt is from home and will return home at D/C. She is on O2 here for low O2 sats, and may need to go home with O2     Action/Plan:   Will follow for Home O2 needs at D/C, and may need HH if goes home with O2   Anticipated DC Date:  02/15/2014   Anticipated DC Plan:  HOME W HOME HEALTH SERVICES      DC Planning Services  CM consult      PAC Choice  DURABLE MEDICAL EQUIPMENT  HOME HEALTH   Choice offered to / List presented to:  C-1 Patient   DME arranged  NEBULIZER/MEDS      DME agency  [facility]     HH arranged  HH-1 RN  HH-10 DISEASE MANAGEMENT  HH-2 PT      HH agency  [facility]   Status of service:  Completed, signed off Medicare Important Message given?  YES (If response is "NO", the following Medicare IM given date fields will be blank) Date Medicare IM given:  02/08/2014 Medicare IM given by:  Anibal Henderson Date Additional Medicare IM given:  02/15/2014 Additional Medicare IM given by:  Anibal Henderson  Discharge Disposition:  HOME [facility] SERVICES  Per UR Regulation:  Reviewed for med. necessity/level of care/duration of stay  If discussed at Long Length of Stay Meetings, dates discussed:    Comments:  02/15/14 1400 Anibal Henderson RN/CM Pt D/C home with [facility]. Sats in the 90s so O2 not needed 02/07/14 1630 [employer] RN/CM

## 2014-02-07 NOTE — Care Management Utilization Note (Signed)
UR completed 

## 2014-02-07 NOTE — Progress Notes (Signed)
From patient's request we discussed and she completed her Health Care Power of [address] and Living Will. A copy was placed in her chart and she also received her original with several copies. We also discussed her commitment to quit smoking.

## 2014-02-08 LAB — FOLATE RBC: RBC FOLATE: 888 ng/mL — AB (ref >280)

## 2014-02-08 LAB — LEGIONELLA ANTIGEN, URINE: Legionella Antigen, Urine: NEGATIVE

## 2014-02-08 MED ORDER — GUAIFENESIN-DM 100-10 MG/5ML PO SYRP
15.0000 mL | ORAL_SOLUTION | Freq: Four times a day (QID) | ORAL | Status: DC | PRN
  Administered 2014-02-08 – 2014-02-14 (×8): 15 mL via ORAL
  Filled 2014-02-08 (×8): qty 15

## 2014-02-08 NOTE — Progress Notes (Signed)
Notified by patient that her pain was a 7 out of 10. Patient did not receive scheduled percocet @ 0000 or 0600 due to the patient being asleep per night shift nurse. Patient also complaining of a cough and requesting something to help with that. MD was notified. MD ordered to give 1200 dose of percocet now and ordered 15ml of robitussin DM every six hours PRN for cough. Patient received percocet @ 1023 and robitussin @ 1024. Will continue to monitor patient at this time.

## 2014-02-08 NOTE — Progress Notes (Signed)
Patient admitted with community-acquired pneumonia on Rocephin and Zithromax intravenously on a beta agonist nebulizer therapy. Continues with cough nonproductive. Severe 2 pack per day nicotine abuse hypertension anxiety I told disorder depression schizoaffective disorder osteoporosis. Clinically improving. Melinda Wood [id number] DOB: 08/14/1955 DOA: 02/06/2014 PCP: Isabella Stalling, MD             Physical Exam: Blood pressure 118/62, pulse 75, temperature 97.9 F (36.6 C), temperature source Oral, resp. rate 20, height  (1.626 Wood), weight 135 lb (61.236 kg), SpO2 90.00%. neck shows no JVD or carotid bruits. Lungs show coarse rhonchi bilaterally bilateral end expiratory wheeze no rales audible. Heart regular rhythm no S3-S4 no heave thrills or rubs. Abdomen Benign.   Investigations:  Recent Results (from the past 240 hour(s))  CULTURE, BLOOD (ROUTINE X 2)     Status: None   Collection Time    02/06/14  8:08 PM      Result Value Ref Range Status   Specimen Description BLOOD RIGHT HAND   Final   Special Requests BOTTLES DRAWN AEROBIC AND ANAEROBIC 6CC EACH   Final   Culture NO GROWTH 1 DAY   Final   Report Status PENDING   Incomplete  CULTURE, BLOOD (ROUTINE X 2)     Status: None   Collection Time    02/06/14  8:08 PM      Result Value Ref Range Status   Specimen Description Blood   Final   Special Requests Normal   Final   Culture NO GROWTH 1 DAY   Final   Report Status PENDING   Incomplete     Basic Metabolic Panel:  Recent Labs  95/62/13 1358 02/07/14 0502  NA 142 144  K 3.6* 4.5  CL 100 103  CO2 34* 33*  GLUCOSE 106* 137*  BUN 8 12  CREATININE 0.77 0.63  CALCIUM 8.6 8.7   Liver Function Tests:  Recent Labs  02/07/14 0502  AST 7  ALT <5  ALKPHOS 61  BILITOT <0.2*  PROT 5.7*  ALBUMIN 2.5*     CBC:  Recent Labs  02/06/14 1358 02/07/14 0502  WBC 13.9* 11.9*  NEUTROABS 10.9*  --   HGB 11.5* 11.4*  HCT 36.3 35.1*  MCV 103.4*  100.3*  PLT 235 223    Dg Chest 2 View  02/06/2014   CLINICAL DATA:  Cough for 1-2 weeks with hypoxia ; history of asthma and tobacco use  EXAM: CHEST  2 VIEW  COMPARISON:  PA and lateral chest x-ray of February 01, 2014  FINDINGS: The lungs are adequately inflated. There is increased density in the left lower lobe consistent with atelectasis or pneumonia. The heart and pulmonary vascularity are normal. The mediastinum is normal in width. There is no pleural effusion. The bony thorax is unremarkable.  IMPRESSION: Left lower lobe atelectasis or pneumonia. There is no evidence of CHF.   Electronically Signed   By: David  [place]   On: 02/06/2014 15:59      Medications:   Impression: Community-acquired pneumonia improving. Active Problems:   Schizoaffective disorder, bipolar type   HTN (hypertension)   CAP (community acquired pneumonia)   COPD exacerbation   Tobacco use disorder   Community acquired pneumonia     Plan: NicoDerm 21 mg patch daily. IV Rocephin and Zithromax as ordered. And albuterol nebulizer every 4 hours while awake.   Consultants: None.   Procedures none.   Antibiotics: IV Zithromax and IV Rocephin daily.  Code Status: Full code.   Family Communication:  None.  Disposition Plan continue course of therapy.  Time spent: 30 minutes.    LOS: 2 days   Melinda Wood   02/08/2014, 1:12 PM

## 2014-02-09 MED ORDER — AZITHROMYCIN 250 MG PO TABS
500.0000 mg | ORAL_TABLET | Freq: Every day | ORAL | Status: AC
  Administered 2014-02-09 – 2014-02-13 (×5): 500 mg via ORAL
  Filled 2014-02-09 (×5): qty 2

## 2014-02-09 MED ORDER — ALBUTEROL SULFATE (2.5 MG/3ML) 0.083% IN NEBU
2.5000 mg | INHALATION_SOLUTION | RESPIRATORY_TRACT | Status: DC
  Administered 2014-02-09 – 2014-02-13 (×20): 2.5 mg via RESPIRATORY_TRACT
  Filled 2014-02-09 (×21): qty 3

## 2014-02-09 MED ORDER — MAGNESIUM HYDROXIDE 400 MG/5ML PO SUSP
60.0000 mL | Freq: Every day | ORAL | Status: DC | PRN
  Administered 2014-02-09 – 2014-02-11 (×3): 60 mL via ORAL
  Filled 2014-02-09 (×3): qty 60

## 2014-02-09 MED ORDER — ALBUTEROL SULFATE (2.5 MG/3ML) 0.083% IN NEBU
2.5000 mg | INHALATION_SOLUTION | RESPIRATORY_TRACT | Status: DC
  Administered 2014-02-09 (×2): 2.5 mg via RESPIRATORY_TRACT
  Filled 2014-02-09 (×2): qty 3

## 2014-02-09 NOTE — Progress Notes (Signed)
The patient 50% improved less wheezing a little end-expiratory wheezing still present bilateral course rhonchi present no rales and no significant dyspnea at rest Melinda Wood ZOX:[id number] DOB: March 14, 1956 DOA: 02/06/2014 PCP: Isabella Stalling, MD             Physical Exam: Blood pressure 108/70, pulse 74, temperature 97.4 F (36.3 C), temperature source Oral, resp. rate 20, height  (1.626 m), weight 135 lb (61.236 kg), SpO2 96.00%. lungs show bilateral coarse rhonchi bilateral end-expiratory wheezes no rales appreciable. Heart rhythm no extra shortening throat abdomen soft and nontender bowel sounds normoactive.   Investigations:  Recent Results (from the past 240 hour(s))  CULTURE, BLOOD (ROUTINE X 2)     Status: None   Collection Time    02/06/14  8:08 PM      Result Value Ref Range Status   Specimen Description BLOOD RIGHT HAND   Final   Special Requests BOTTLES DRAWN AEROBIC AND ANAEROBIC 6CC EACH   Final   Culture NO GROWTH 3 DAYS   Final   Report Status PENDING   Incomplete  CULTURE, BLOOD (ROUTINE X 2)     Status: None   Collection Time    02/06/14  8:08 PM      Result Value Ref Range Status   Specimen Description BLOOD LEFT HAND   Final   Special Requests     Final   Value: BOTTLES DRAWN AEROBIC AND ANAEROBIC AEB=10CC ANA=6CC   Culture NO GROWTH 3 DAYS   Final   Report Status PENDING   Incomplete     Basic Metabolic Panel:  Recent Labs  81/19/14 1358 02/07/14 0502  NA 142 144  K 3.6* 4.5  CL 100 103  CO2 34* 33*  GLUCOSE 106* 137*  BUN 8 12  CREATININE 0.77 0.63  CALCIUM 8.6 8.7   Liver Function Tests:  Recent Labs  02/07/14 0502  AST 7  ALT <5  ALKPHOS 61  BILITOT <0.2*  PROT 5.7*  ALBUMIN 2.5*     CBC:  Recent Labs  02/06/14 1358 02/07/14 0502  WBC 13.9* 11.9*  NEUTROABS 10.9*  --   HGB 11.5* 11.4*  HCT 36.3 35.1*  MCV 103.4* 100.3*  PLT 235 223    No results found.   Medications} reviewed.  Impression: Left  lower lobe CAP with asthmatic bronchitis. Active Problems:   Schizoaffective disorder, bipolar type   HTN (hypertension)   CAP (community acquired pneumonia)   COPD exacerbation   Tobacco use disorder   Community acquired pneumonia     Plan: IV Rocephin and Zithromax as ordered albuterol nebulizer unit dose increased to every 4 hours while awake.  Consultant none.   Procedures none.   Antibiotics: IV Zithromax and Rocephin daily                  Code Status: Full.  Family Communication:  None.  Disposition Plan continue IV antibiotics and beta agonists nicotine patch for smoking cessation counseling.  Time spent: 30 minutes.   LOS: 3 days   Sharmila Wrobleski M   02/09/2014, 11:05 AM

## 2014-02-09 NOTE — Progress Notes (Signed)
Notified MD that patient had not had a bowel movement since 02/05/2014. Patient was requesting something to help bowels move. MD ordered 60ml milk of magnesia daily PRN for constipation. Patient received milk of magnesia @ 1723. Will continue to monitor patient at this time.

## 2014-02-09 NOTE — Progress Notes (Signed)
PHARMACIST - PHYSICIAN COMMUNICATION DR:   Janna Arch CONCERNING: Antibiotic IV to Oral Route Change Policy  RECOMMENDATION: This patient is receiving Zithromax by the intravenous route.  Based on criteria approved by the Pharmacy and Therapeutics Committee, the antibiotic(s) is/are being converted to the equivalent oral dose form(s).   DESCRIPTION: These criteria include:  Patient being treated for a respiratory tract infection, urinary tract infection, cellulitis or clostridium difficile associated diarrhea if on metronidazole  The patient is not neutropenic and does not exhibit a GI malabsorption state  The patient is eating (either orally or via tube) and/or has been taking other orally administered medications for a least 24 hours  The patient is improving clinically and has a Tmax < 100.5  **Legionella negative- Consider d/c Zithromax.  If you have questions about this conversion, please contact the Pharmacy Department    [phone] )  [facility]   [phone] )  [facility]    [phone] )  [facility]   [phone] )  [facility]    Junita Push, PharmD, BCPS 02/09/2014@9 :15 AM

## 2014-02-10 NOTE — Progress Notes (Signed)
Left lower lobe pneumonia now with pleuritic chest pain which is expected no anginal type chest pain no known coronary disease coughing without sputum production on dual antibiotics nebulizer therapy and nicotine patch. Melinda Wood:[id number] DOB: 01/30/1956 DOA: 02/06/2014 PCP: Melinda Stalling, MD             Physical Exam: Blood pressure 121/75, pulse 73, temperature 97.7 F (36.5 C), temperature source Oral, resp. rate 16, height  (1.626 m), weight 135 lb (61.236 kg), SpO2 95.00%. lungs show coarse rhonchi bilaterally no rales appreciable mild end expiratory wheeze nebulizer increased to every 4 hours while awake   Investigations:  Recent Results (from the past 240 hour(s))  CULTURE, BLOOD (ROUTINE X 2)     Status: None   Collection Time    02/06/14  8:08 PM      Result Value Ref Range Status   Specimen Description BLOOD RIGHT HAND   Final   Special Requests BOTTLES DRAWN AEROBIC AND ANAEROBIC 6CC EACH   Final   Culture NO GROWTH 4 DAYS   Final   Report Status PENDING   Incomplete  CULTURE, BLOOD (ROUTINE X 2)     Status: None   Collection Time    02/06/14  8:08 PM      Result Value Ref Range Status   Specimen Description BLOOD LEFT HAND   Final   Special Requests     Final   Value: BOTTLES DRAWN AEROBIC AND ANAEROBIC AEB=10CC ANA=6CC   Culture NO GROWTH 4 DAYS   Final   Report Status PENDING   Incomplete     Basic Metabolic Panel: No results found for this basename: NA, K, CL, CO2, GLUCOSE, BUN, CREATININE, CALCIUM, MG, PHOS,  in the last 72 hours Liver Function Tests: No results found for this basename: AST, ALT, ALKPHOS, BILITOT, PROT, ALBUMIN,  in the last 72 hours   CBC: No results found for this basename: WBC, NEUTROABS, HGB, HCT, MCV, PLT,  in the last 72 hours  No results found.    Medications:    Impression: Left lower lobe pneumonia community-acquired. Associated bronchospasm. Active Problems:   Schizoaffective disorder, bipolar  type   HTN (hypertension)   CAP (community acquired pneumonia)   COPD exacerbation   Tobacco use disorder   Community acquired pneumonia     Plan: Continue Rocephin and risk Zithromax and beta agonist nebulizer reassured patient that this pain was not cardiac in nature.  Consultants: None   Procedures none   Antibiotics: Rocephin IV Zithromax by mouth                  Code Status: Full   Family Communication: None  Disposition Plan aggressive smoking cessation counseling.  Time spent: 30 minutes   LOS: 4 days   Melinda Wood M   02/10/2014, 12:38 PM

## 2014-02-11 ENCOUNTER — Inpatient Hospital Stay (HOSPITAL_COMMUNITY): Payer: PRIVATE HEALTH INSURANCE

## 2014-02-11 LAB — CULTURE, BLOOD (ROUTINE X 2)
CULTURE: NO GROWTH
Culture: NO GROWTH

## 2014-02-11 NOTE — Progress Notes (Signed)
Patient admitted with left lower lobe infiltrate atelectasis severe COPD. On delayed antibiotics and beta agonist nebulizer every 4 hours. Continued with cough nonsmoker. Currently afebrile Melinda Wood [id number] DOB: 1956/04/28 DOA: 02/06/2014 PCP: Melinda Curet, MD             Physical Exam: Blood pressure 104/68, pulse 75, temperature 98 F (36.7 C), temperature source Oral, resp. rate 13, height 5' 4"  (1.626 m), weight 135 lb (61.236 kg), SpO2 100.00%. lungs show coarse rhonchi bilaterally mild end expiratory wheeze no rales audible heartburn nausea or S4 negative rubs abdomen soft nontender bowel sounds normoactive   Investigations:  Recent Results (from the past 240 hour(s))  CULTURE, BLOOD (ROUTINE X 2)     Status: None   Collection Time    02/06/14  8:08 PM      Result Value Ref Range Status   Specimen Description BLOOD RIGHT HAND   Final   Special Requests BOTTLES DRAWN AEROBIC AND ANAEROBIC [place]   Final   Culture NO GROWTH 5 DAYS   Final   Report Status 02/11/2014 FINAL   Final  CULTURE, BLOOD (ROUTINE X 2)     Status: None   Collection Time    02/06/14  8:08 PM      Result Value Ref Range Status   Specimen Description BLOOD LEFT HAND   Final   Special Requests     Final   Value: BOTTLES DRAWN AEROBIC AND ANAEROBIC AEB=10CC ANA=6CC   Culture NO GROWTH 5 DAYS   Final   Report Status 02/11/2014 FINAL   Final     Basic Metabolic Panel: No results found for this basename: NA, K, CL, CO2, GLUCOSE, BUN, CREATININE, CALCIUM, MG, PHOS,  in the last 72 hours Liver Function Tests: No results found for this basename: AST, ALT, ALKPHOS, BILITOT, PROT, ALBUMIN,  in the last 72 hours   CBC: No results found for this basename: WBC, NEUTROABS, HGB, HCT, MCV, PLT,  in the last 72 hours  No results found.    Medications:   Impression:  Active Problems:   Schizoaffective disorder, bipolar type   HTN (hypertension)   CAP (community acquired  pneumonia)   COPD exacerbation   Tobacco use disorder   Community acquired pneumonia     Plan: Repeat chest x-ray B. met in CBC with differential in the a.m. to assess resolution. Continue dual antibiotics and beta agonist nebulizer   Consultants: None    Procedures   Antibiotics: IV Rocephin 1 g every 24 and by mouth Zithromax 250 daily                   Code Status: Well  Family Communication:   Disposition Plan   Time spent: 30 minutes   LOS: 5 days   Melinda Wood M   02/11/2014, 1:17 PM

## 2014-02-12 LAB — BLOOD GAS, ARTERIAL
ACID-BASE EXCESS: 13.7 mmol/L — AB (ref 0.0–2.0)
Bicarbonate: 39.5 mEq/L — ABNORMAL HIGH (ref 20.0–24.0)
Drawn by: 234301
O2 Content: 2 L/min
O2 SAT: 93.5 %
PATIENT TEMPERATURE: 37
PO2 ART: 72.3 mmHg — AB (ref 80.0–100.0)
TCO2: 35.7 mmol/L (ref 0–100)
pCO2 arterial: 69.8 mmHg (ref 35.0–45.0)
pH, Arterial: 7.371 (ref 7.350–7.450)

## 2014-02-12 LAB — CBC WITH DIFFERENTIAL/PLATELET
BASOS ABS: 0.1 10*3/uL (ref 0.0–0.1)
Basophils Relative: 1 % (ref 0–1)
EOS ABS: 0.2 10*3/uL (ref 0.0–0.7)
Eosinophils Relative: 2 % (ref 0–5)
HCT: 38.3 % (ref 36.0–46.0)
HEMOGLOBIN: 12.1 g/dL (ref 12.0–15.0)
Lymphocytes Relative: 31 % (ref 12–46)
Lymphs Abs: 2.7 10*3/uL (ref 0.7–4.0)
MCH: 32.5 pg (ref 26.0–34.0)
MCHC: 31.6 g/dL (ref 30.0–36.0)
MCV: 103 fL — ABNORMAL HIGH (ref 78.0–100.0)
MONO ABS: 0.7 10*3/uL (ref 0.1–1.0)
Monocytes Relative: 8 % (ref 3–12)
NEUTROS PCT: 58 % (ref 43–77)
Neutro Abs: 5 10*3/uL (ref 1.7–7.7)
Platelets: 210 10*3/uL (ref 150–400)
RBC: 3.72 MIL/uL — ABNORMAL LOW (ref 3.87–5.11)
RDW: 14.7 % (ref 11.5–15.5)
WBC: 8.7 10*3/uL (ref 4.0–10.5)

## 2014-02-12 LAB — BASIC METABOLIC PANEL
Anion gap: 5 (ref 5–15)
BUN: 20 mg/dL (ref 6–23)
CALCIUM: 9.2 mg/dL (ref 8.4–10.5)
CO2: 43 mEq/L (ref 19–32)
Chloride: 95 mEq/L — ABNORMAL LOW (ref 96–112)
Creatinine, Ser: 1.01 mg/dL (ref 0.50–1.10)
GFR, EST AFRICAN AMERICAN: 69 mL/min — AB (ref >90)
GFR, EST NON AFRICAN AMERICAN: 60 mL/min — AB (ref >90)
GLUCOSE: 87 mg/dL (ref 70–99)
POTASSIUM: 4.9 meq/L (ref 3.7–5.3)
SODIUM: 143 meq/L (ref 137–147)

## 2014-02-12 MED ORDER — METHYLPREDNISOLONE SODIUM SUCC 125 MG IJ SOLR
125.0000 mg | Freq: Four times a day (QID) | INTRAMUSCULAR | Status: DC
  Administered 2014-02-12 – 2014-02-13 (×5): 125 mg via INTRAVENOUS
  Filled 2014-02-12 (×5): qty 2

## 2014-02-12 NOTE — Progress Notes (Addendum)
ABG results  Received from Respiratory Therapist Lucretia.    PH= 7.37 PC02= 69.8 PO2= 72.3 O2 Sat= 93.5 on 2 Liters [place] HCO3= 39.5  Results read back and verified.  Paged Doctor DonDiego. Awaiting callback.  Callback received at 8:40am.  Solumedrol 125 IV q 6h ordered, breathing treatments continued q4 hours, continue Nasal Cannula O2 at 2 Liters. Titrate nasal oxygen down to maintain o2 saturation greater than 90.  Read back and verified.

## 2014-02-12 NOTE — Progress Notes (Signed)
Dr. Janna Arch paged with cO2 of 43, Orders given to decrease O2 to 2L Hand then draw ABG 20 minutes later.

## 2014-02-12 NOTE — Progress Notes (Signed)
No BM 9/15. Milk of Magnesia given PO.

## 2014-02-12 NOTE — Progress Notes (Signed)
Patient had some CO2 retention with appropriate metabolic alkalosis PO2 PCO2 and 70 70 club . Steroids added intravenously Solu-Medrol 125 IV q 6 h  Melinda Wood ZOX:[id number] DOB: Jul 02, 1955 DOA: 02/06/2014 PCP: Melinda Stalling, MD             Physical Exam: Blood pressure 94/72, pulse 60, temperature 98.1 F (36.7 C), temperature source Oral, resp. rate 16, height  (1.626 m), weight 135 lb (61.236 kg), SpO2 95.00%. less pronounced wheeze than yesterday continue coarse rhonchi no rales appreciable heart regular rhythm no murmurs gallops or rubs abdomen essentially benign.   Investigations:  Recent Results (from the past 240 hour(s))  CULTURE, BLOOD (ROUTINE X 2)     Status: None   Collection Time    02/06/14  8:08 PM      Result Value Ref Range Status   Specimen Description BLOOD RIGHT HAND   Final   Special Requests BOTTLES DRAWN AEROBIC AND ANAEROBIC 6CC EACH   Final   Culture NO GROWTH 5 DAYS   Final   Report Status 02/11/2014 FINAL   Final  CULTURE, BLOOD (ROUTINE X 2)     Status: None   Collection Time    02/06/14  8:08 PM      Result Value Ref Range Status   Specimen Description BLOOD LEFT HAND   Final   Special Requests     Final   Value: BOTTLES DRAWN AEROBIC AND ANAEROBIC AEB=10CC ANA=6CC   Culture NO GROWTH 5 DAYS   Final   Report Status 02/11/2014 FINAL   Final     Basic Metabolic Panel:  Recent Labs  81/19/14 0540  NA 143  K 4.9  CL 95*  CO2 43*  GLUCOSE 87  BUN 20  CREATININE 1.01  CALCIUM 9.2   Liver Function Tests: No results found for this basename: AST, ALT, ALKPHOS, BILITOT, PROT, ALBUMIN,  in the last 72 hours   CBC:  Recent Labs  02/12/14 0540  WBC 8.7  NEUTROABS 5.0  HGB 12.1  HCT 38.3  MCV 103.0*  PLT 210    Dg Chest 2 View  02/11/2014   CLINICAL DATA:  Cough and weakness.  Left lower lobe infiltrate.  EXAM: CHEST  2 VIEW  COMPARISON:  Radiographs 02/01/2014 and 02/06/2014.  FINDINGS: There is interval improved  aeration of both lung bases. The left lower lobe airspace disease noted previously has resolved. There is linear subsegmental atelectasis or scarring in both lung bases. There is no edema or significant pleural effusion. The heart size and mediastinal contours are stable. The bones appear unremarkable. Telemetry leads overlie the chest.  IMPRESSION: Interval clearing of left lower lobe infiltrate. No acute cardiopulmonary process.   Electronically Signed   By: Roxy Horseman M.D.   On: 02/11/2014 15:42      Medications:  Impression: Active bronchospasm. Active Problems:   Schizoaffective disorder, bipolar type   HTN (hypertension)   CAP (community acquired pneumonia)   COPD exacerbation   Tobacco use disorder   Community acquired pneumonia     Plan: Solu-Medrol 2580 every 6 hours. Albuterol every 4 hours while awake. IV Rocephin  Consultants:    Procedures   Antibiotics: IV Rocephin by mouth Zithromax                  Code Status: Full   Family Communication:    Disposition Plan continue status IV Solu-Medrol antibiotics and nebulizer therapy assess broncho-spastic improvement  Time spent: 30 minutes  LOS: 6 days   Gabbrielle Mcnicholas M   02/12/2014, 2:42 PM

## 2014-02-13 MED ORDER — METHYLPREDNISOLONE SODIUM SUCC 125 MG IJ SOLR
80.0000 mg | Freq: Four times a day (QID) | INTRAMUSCULAR | Status: DC
  Administered 2014-02-13 – 2014-02-15 (×8): 80 mg via INTRAVENOUS
  Filled 2014-02-13 (×9): qty 2

## 2014-02-13 MED ORDER — ALBUTEROL SULFATE (2.5 MG/3ML) 0.083% IN NEBU
2.5000 mg | INHALATION_SOLUTION | Freq: Three times a day (TID) | RESPIRATORY_TRACT | Status: DC
  Administered 2014-02-14 – 2014-02-15 (×6): 2.5 mg via RESPIRATORY_TRACT
  Filled 2014-02-13 (×6): qty 3

## 2014-02-13 NOTE — Progress Notes (Signed)
Less bronchospasm today less dyspnea markedly improved. Melinda Wood ZOX:[id number] DOB: April 17, 1956 DOA: 02/06/2014 PCP: Isabella Stalling, MD             Physical Exam: Blood pressure 107/62, pulse 78, temperature 98.4 F (36.9 C), temperature source Oral, resp. rate 18, height  (1.626 m), weight 135 lb (61.236 kg), SpO2 92.00%. neck no JVD no carotid bruits no thyromegaly. Lungs diminished wheezes compared to yesterday scattered rhonchi no rales appreciable heart regular no S3-S4 no heave thrills or rubs   Investigations:  Recent Results (from the past 240 hour(s))  CULTURE, BLOOD (ROUTINE X 2)     Status: None   Collection Time    02/06/14  8:08 PM      Result Value Ref Range Status   Specimen Description BLOOD RIGHT HAND   Final   Special Requests BOTTLES DRAWN AEROBIC AND ANAEROBIC 6CC EACH   Final   Culture NO GROWTH 5 DAYS   Final   Report Status 02/11/2014 FINAL   Final  CULTURE, BLOOD (ROUTINE X 2)     Status: None   Collection Time    02/06/14  8:08 PM      Result Value Ref Range Status   Specimen Description BLOOD LEFT HAND   Final   Special Requests     Final   Value: BOTTLES DRAWN AEROBIC AND ANAEROBIC AEB=10CC ANA=6CC   Culture NO GROWTH 5 DAYS   Final   Report Status 02/11/2014 FINAL   Final     Basic Metabolic Panel:  Recent Labs  81/19/14 0540  NA 143  K 4.9  CL 95*  CO2 43*  GLUCOSE 87  BUN 20  CREATININE 1.01  CALCIUM 9.2   Liver Function Tests: No results found for this basename: AST, ALT, ALKPHOS, BILITOT, PROT, ALBUMIN,  in the last 72 hours   CBC:  Recent Labs  02/12/14 0540  WBC 8.7  NEUTROABS 5.0  HGB 12.1  HCT 38.3  MCV 103.0*  PLT 210    Dg Chest 2 View  02/11/2014   CLINICAL DATA:  Cough and weakness.  Left lower lobe infiltrate.  EXAM: CHEST  2 VIEW  COMPARISON:  Radiographs 02/01/2014 and 02/06/2014.  FINDINGS: There is interval improved aeration of both lung bases. The left lower lobe airspace disease noted  previously has resolved. There is linear subsegmental atelectasis or scarring in both lung bases. There is no edema or significant pleural effusion. The heart size and mediastinal contours are stable. The bones appear unremarkable. Telemetry leads overlie the chest.  IMPRESSION: Interval clearing of left lower lobe infiltrate. No acute cardiopulmonary process.   Electronically Signed   By: Roxy Horseman M.D.   On: 02/11/2014 15:42      Medications:   Impression: Acute bronchospasm. Active Problems:   Schizoaffective disorder, bipolar type   HTN (hypertension)   CAP (community acquired pneumonia)   COPD exacerbation   Tobacco use disorder   Community acquired pneumonia     Plan: Decrease Solu-Medrol to 80 IV every 6 hours   Consultants:     Procedures   Antibiotics: Rocephin and Zithromax IV                  Code Status: Full.  Family Communication:    Disposition Plan   Time spent: 30 minutes.   LOS: 7 days   Param Capri M   02/13/2014, 12:32 PM

## 2014-02-14 MED ORDER — PREDNISONE 20 MG PO TABS
20.0000 mg | ORAL_TABLET | Freq: Two times a day (BID) | ORAL | Status: DC
Start: 2014-02-14 — End: 2014-02-16
  Administered 2014-02-14 – 2014-02-15 (×3): 20 mg via ORAL
  Filled 2014-02-14 (×3): qty 1

## 2014-02-14 NOTE — Progress Notes (Signed)
Nutrition Brief Note  Patient identified on the Malnutrition Screening Tool (MST) Report  Pt presents with community acquired pneumonia. Hx of COPD, Schizoaffective disorder and tobacco abuse. Hx of asthma.   Body mass index is 23.16 kg/(m^2). Patient meets criteria for normal based on current BMI. Pt denies weight loss. Weight hx reviewed.  Home diet is regular. Current diet order is Heart healthy , patient is consuming approximately 95-100% of meals at this time. Labs and medications reviewed. TSH, B12, folate levels pending.  No nutrition interventions warranted at this time. If nutrition issues arise, please consult RD.   Royann Shivers MS,RD,CSG,LDN Office: [phone] Pager: [phone]

## 2014-02-14 NOTE — Progress Notes (Signed)
Less wheeze today respiratory status improved Melinda Wood [id number]:[id number] DOB: Jul 08, 1955 DOA: 02/06/2014 PCP: Melinda Stalling, MD             Physical Exam: Blood pressure 94/48, pulse 91, temperature 98.1 F (36.7 C), temperature source Oral, resp. rate 20, height  (1.626 m), weight 135 lb (61.236 kg), SpO2 92.00%. neck no JVD no carotid bruits no thyromegaly lungs reveal scattered rhonchi milder and currently no rales audible. Heart regular rhythm no gallops heaves thrills or rubs. Extremities no cyanosis or edema   Investigations:  Recent Results (from the past 240 hour(s))  CULTURE, BLOOD (ROUTINE X 2)     Status: None   Collection Time    02/06/14  8:08 PM      Result Value Ref Range Status   Specimen Description BLOOD RIGHT HAND   Final   Special Requests BOTTLES DRAWN AEROBIC AND ANAEROBIC 6CC EACH   Final   Culture NO GROWTH 5 DAYS   Final   Report Status 02/11/2014 FINAL   Final  CULTURE, BLOOD (ROUTINE X 2)     Status: None   Collection Time    02/06/14  8:08 PM      Result Value Ref Range Status   Specimen Description BLOOD LEFT HAND   Final   Special Requests     Final   Value: BOTTLES DRAWN AEROBIC AND ANAEROBIC AEB=10CC ANA=6CC   Culture NO GROWTH 5 DAYS   Final   Report Status 02/11/2014 FINAL   Final     Basic Metabolic Panel:  Recent Labs  02/72/53 0540  NA 143  K 4.9  CL 95*  CO2 43*  GLUCOSE 87  BUN 20  CREATININE 1.01  CALCIUM 9.2   Liver Function Tests: No results found for this basename: AST, ALT, ALKPHOS, BILITOT, PROT, ALBUMIN,  in the last 72 hours   CBC:  Recent Labs  02/12/14 0540  WBC 8.7  NEUTROABS 5.0  HGB 12.1  HCT 38.3  MCV 103.0*  PLT 210    No results found.    Medications:   Impression: Chronic bronchitis. Will asthmatic bronchitis. Active Problems:   Schizoaffective disorder, bipolar type   HTN (hypertension)   CAP (community acquired pneumonia)   COPD exacerbation   Tobacco use  disorder   Community acquired pneumonia     Plan: DC IV saline switch to by mouth prednisone 20 twice a day   Consultants:    Procedures   Antibiotics:                   Code Status:   Family Communication:    Disposition Plan switch to by mouth prednisone. DC IV Solu-Medrol. Home health care with nebulizer therapy  Time spent: 30 minutes   LOS: 8 days   [name] M   02/14/2014, 2:19 PM

## 2014-02-15 MED ORDER — PREDNISONE 20 MG PO TABS: 20.0000 mg | ORAL_TABLET | Freq: Two times a day (BID) | ORAL | Status: AC

## 2014-02-15 MED ORDER — ALBUTEROL SULFATE (2.5 MG/3ML) 0.083% IN NEBU: 2.5000 mg | INHALATION_SOLUTION | RESPIRATORY_TRACT | Status: AC | PRN

## 2014-02-15 MED ORDER — NICOTINE 21 MG/24HR TD PT24: 21.0000 mg | MEDICATED_PATCH | Freq: Every day | TRANSDERMAL | Status: AC

## 2014-02-15 NOTE — Plan of Care (Signed)
Problem: Phase III Progression Outcomes Goal: O2 sats > or equal to 93% on room air Outcome: Completed/Met Date Met:  02/15/14 Pt ambulated in room with standard walker and standby assist from RN. Pt tolerated well. O2 sats on RA 92-94 % while ambulating.     

## 2014-02-15 NOTE — Discharge Summary (Signed)
Physician Discharge Summary  Melinda Wood ZOX:[id number] DOB: 1955-12-31 DOA: 02/06/2014  PCP: Isabella Stalling, MD  Admit date: 02/06/2014 Discharge date: 02/15/2014   Recommendations for Outpatient Follow-up:  The patient will follow up my office in 4 days' time to assess respiratory status she'll strengthening ambulation capacity. Discharge Diagnoses:  Active Problems:   Schizoaffective disorder, bipolar type   HTN (hypertension)   CAP (community acquired pneumonia)   COPD exacerbation   Tobacco use disorder   Community acquired pneumonia   Discharge Condition: Good.  Filed Weights   02/06/14 1215  Weight: 135 lb (61.236 kg)    History of present illness:  Patient presents to [place] who patient is a 2 pack per day smoker presented with severe coughing dyspnea bronchospasm present in the left lower lobe infiltrate versus atelectasis placed on delayed about Rocephin and Zithromax for CAP continue to improve over 5-6 day period. The patient had worsening right is present from increased respiratory distress to have some hypercapnic respiratory insufficiency and placed on IV Solu-Medrol and 25 every 6 hours treatment of this report 5  Oral prednisone and recently discharged with nebulizer therapy and oral prednisone clear tach hemodynamically stable throughout hospital stay other than her respiratory status chest x-ray showed resolution of left lower lobe infiltrate/atelectasis  Hospital Course:  Patient continues improvement in moderately impaired respiratory insufficiency and hypercapnic variety which improved with steroid administration for 3 days. Her chest x-ray showed complete resolution of the left lobe infiltrate  Procedures:    Consultations:  None  Discharge Instructions     Medication List    STOP taking these medications       pregabalin 50 MG capsule  Commonly known as:  LYRICA      TAKE these medications       albuterol (2.5 MG/3ML) 0.083%  nebulizer solution  Commonly known as:  PROVENTIL  Take 3 mLs (2.5 mg total) by nebulization every 4 (four) hours as needed for wheezing or shortness of breath.     amLODipine 5 MG tablet  Commonly known as:  NORVASC  Take 1 tablet (5 mg total) by mouth daily. For blood pressure control.     ascorbic acid 500 MG tablet  Commonly known as:  VITAMIN C  Take 1 tablet (500 mg total) by mouth daily. For nutritional supplementation.     ATELVIA 35 MG Tbec  Generic drug:  Risedronate Sodium  Take 35 mg by mouth every 7 (seven) days.     atorvastatin 20 MG tablet  Commonly known as:  LIPITOR  Take 1 tablet (20 mg total) by mouth at bedtime. For cholesterol control.     budesonide-formoterol 160-4.5 MCG/ACT inhaler  Commonly known as:  SYMBICORT  Inhale 2 puffs into the lungs 2 (two) times daily.     calcium-vitamin D 500-200 MG-UNIT per tablet  Commonly known as:  OSCAL WITH D  Take 1 tablet by mouth 2 (two) times daily in the am and at bedtime.. For bone health.     cyanocobalamin 1000 MCG tablet  Take 1 tablet (1,000 mcg total) by mouth daily. For nutritional supplementation.     diazepam 5 MG tablet  Commonly known as:  VALIUM  Take 5 mg by mouth 3 (three) times daily.     divalproex 500 MG 24 hr tablet  Commonly known as:  DEPAKOTE ER  Take 2 tablets (1,000 mg total) by mouth at bedtime. For mood stabilization.     fesoterodine 4 MG Tb24 tablet  Commonly known as:  TOVIAZ  Take 1 tablet (4 mg total) by mouth daily. Urinary incontinence     methylphenidate 20 MG tablet  Commonly known as:  RITALIN  Take 20 mg by mouth 3 (three) times daily.     nicotine 21 mg/24hr patch  Commonly known as:  NICODERM CQ - dosed in mg/24 hours  Place 1 patch (21 mg total) onto the skin daily.     OLANZapine 10 MG tablet  Commonly known as:  ZYPREXA  Take 1 tablet (10 mg total) by mouth at bedtime. For mood stabilization, sleep and clarity of thoughts.     oxyCODONE-acetaminophen 5-325  MG per tablet  Commonly known as:  PERCOCET/ROXICET  Take 1 tablet by mouth 4 (four) times daily.     PARoxetine 40 MG tablet  Commonly known as:  PAXIL  Take 1 tablet (40 mg total) by mouth daily. For depression and anxiety.     predniSONE 20 MG tablet  Commonly known as:  DELTASONE  Take 1 tablet (20 mg total) by mouth 2 (two) times daily with a meal.     traZODone 100 MG tablet  Commonly known as:  DESYREL  Take 1 tablet (100 mg total) by mouth at bedtime. For sleep.       Allergies  Allergen Reactions  . Arthrotec [Diclofenac-Misoprostol] Hives  . Sulfonamide Derivatives Hives      The results of significant diagnostics from this hospitalization (including imaging, microbiology, ancillary and laboratory) are listed below for reference.    Significant Diagnostic Studies: Dg Chest 2 View  02/11/2014   CLINICAL DATA:  Cough and weakness.  Left lower lobe infiltrate.  EXAM: CHEST  2 VIEW  COMPARISON:  Radiographs 02/01/2014 and 02/06/2014.  FINDINGS: There is interval improved aeration of both lung bases. The left lower lobe airspace disease noted previously has resolved. There is linear subsegmental atelectasis or scarring in both lung bases. There is no edema or significant pleural effusion. The heart size and mediastinal contours are stable. The bones appear unremarkable. Telemetry leads overlie the chest.  IMPRESSION: Interval clearing of left lower lobe infiltrate. No acute cardiopulmonary process.   Electronically Signed   By: Roxy Horseman M.D.   On: 02/11/2014 15:42   Dg Chest 2 View  02/06/2014   CLINICAL DATA:  Cough for 1-2 weeks with hypoxia ; history of asthma and tobacco use  EXAM: CHEST  2 VIEW  COMPARISON:  PA and lateral chest x-ray of February 01, 2014  FINDINGS: The lungs are adequately inflated. There is increased density in the left lower lobe consistent with atelectasis or pneumonia. The heart and pulmonary vascularity are normal. The mediastinum is normal in  width. There is no pleural effusion. The bony thorax is unremarkable.  IMPRESSION: Left lower lobe atelectasis or pneumonia. There is no evidence of CHF.   Electronically Signed   By: David  [place]   On: 02/06/2014 15:59   Dg Chest 2 View (if Patient Has Fever And/or Copd)  02/01/2014   CLINICAL DATA:  Shortness of breath, cough, chest pain  EXAM: CHEST  2 VIEW  COMPARISON:  01/01/2012  FINDINGS: Chronic interstitial markings/emphysematous changes. No focal consolidation. No pleural effusion or pneumothorax.  Heart is normal in size.  Degenerative changes of the visualized thoracolumbar spine. Status post ORIF of the left proximal humerus.  IMPRESSION: No evidence of acute cardiopulmonary disease.   Electronically Signed   By: Charline Bills M.D.   On: 02/01/2014 00:42  Microbiology: Recent Results (from the past 240 hour(s))  CULTURE, BLOOD (ROUTINE X 2)     Status: None   Collection Time    02/06/14  8:08 PM      Result Value Ref Range Status   Specimen Description BLOOD RIGHT HAND   Final   Special Requests BOTTLES DRAWN AEROBIC AND ANAEROBIC 6CC EACH   Final   Culture NO GROWTH 5 DAYS   Final   Report Status 02/11/2014 FINAL   Final  CULTURE, BLOOD (ROUTINE X 2)     Status: None   Collection Time    02/06/14  8:08 PM      Result Value Ref Range Status   Specimen Description BLOOD LEFT HAND   Final   Special Requests     Final   Value: BOTTLES DRAWN AEROBIC AND ANAEROBIC AEB=10CC ANA=6CC   Culture NO GROWTH 5 DAYS   Final   Report Status 02/11/2014 FINAL   Final     Labs: Basic Metabolic Panel:  Recent Labs Lab 02/12/14 0540  NA 143  K 4.9  CL 95*  CO2 43*  GLUCOSE 87  BUN 20  CREATININE 1.01  CALCIUM 9.2   Liver Function Tests: No results found for this basename: AST, ALT, ALKPHOS, BILITOT, PROT, ALBUMIN,  in the last 168 hours No results found for this basename: LIPASE, AMYLASE,  in the last 168 hours No results found for this basename: AMMONIA,  in the last 168  hours CBC:  Recent Labs Lab 02/12/14 0540  WBC 8.7  NEUTROABS 5.0  HGB 12.1  HCT 38.3  MCV 103.0*  PLT 210   Cardiac Enzymes: No results found for this basename: CKTOTAL, CKMB, CKMBINDEX, TROPONINI,  in the last 168 hours BNP: BNP (last 3 results) No results found for this basename: PROBNP,  in the last 8760 hours CBG: No results found for this basename: GLUCAP,  in the last 168 hours     Signed:  Cieara Stierwalt M  [facility] Pager: [phone] 02/15/2014, 1:09 PM

## 2014-02-15 NOTE — Progress Notes (Signed)
Pt to be discharged home today per Dr. Janna Arch. Pt's VSS. Pt's IV site D/C'd and WDL. Pt provided with home medication list, discharge instructions and prescriptions. Verbalized understanding. Pt to be picked up by daughter around 2200 after daughter gets off work.  Patient aware of above, and oncoming night shift RN to be made aware.

## 2014-02-16 ENCOUNTER — Encounter (HOSPITAL_COMMUNITY): Payer: Self-pay | Admitting: Emergency Medicine

## 2014-02-16 ENCOUNTER — Observation Stay (HOSPITAL_COMMUNITY)
Admission: EM | Admit: 2014-02-16 | Discharge: 2014-02-19 | Disposition: A | Discharge status: Home / Self Care | Payer: PRIVATE HEALTH INSURANCE | Attending: Family Medicine | Admitting: Family Medicine

## 2014-02-16 ENCOUNTER — Emergency Department (HOSPITAL_COMMUNITY): Payer: PRIVATE HEALTH INSURANCE

## 2014-02-16 DIAGNOSIS — R0902 Hypoxemia: Secondary | ICD-10-CM

## 2014-02-16 DIAGNOSIS — Z79899 Other long term (current) drug therapy: Secondary | ICD-10-CM | POA: Diagnosis not present

## 2014-02-16 DIAGNOSIS — F319 Bipolar disorder, unspecified: Secondary | ICD-10-CM | POA: Insufficient documentation

## 2014-02-16 DIAGNOSIS — F17200 Nicotine dependence, unspecified, uncomplicated: Secondary | ICD-10-CM | POA: Diagnosis present

## 2014-02-16 DIAGNOSIS — E7800 Pure hypercholesterolemia, unspecified: Secondary | ICD-10-CM | POA: Diagnosis not present

## 2014-02-16 DIAGNOSIS — S0990XA Unspecified injury of head, initial encounter: Secondary | ICD-10-CM | POA: Insufficient documentation

## 2014-02-16 DIAGNOSIS — M6281 Muscle weakness (generalized): Secondary | ICD-10-CM

## 2014-02-16 DIAGNOSIS — R55 Syncope and collapse: Secondary | ICD-10-CM | POA: Insufficient documentation

## 2014-02-16 DIAGNOSIS — Z87891 Personal history of nicotine dependence: Secondary | ICD-10-CM | POA: Diagnosis not present

## 2014-02-16 DIAGNOSIS — J449 Chronic obstructive pulmonary disease, unspecified: Secondary | ICD-10-CM | POA: Insufficient documentation

## 2014-02-16 DIAGNOSIS — J438 Other emphysema: Secondary | ICD-10-CM

## 2014-02-16 DIAGNOSIS — J439 Emphysema, unspecified: Secondary | ICD-10-CM | POA: Diagnosis present

## 2014-02-16 DIAGNOSIS — J4489 Other specified chronic obstructive pulmonary disease: Secondary | ICD-10-CM | POA: Insufficient documentation

## 2014-02-16 DIAGNOSIS — Y939 Activity, unspecified: Secondary | ICD-10-CM | POA: Insufficient documentation

## 2014-02-16 DIAGNOSIS — S199XXA Unspecified injury of neck, initial encounter: Secondary | ICD-10-CM

## 2014-02-16 DIAGNOSIS — I10 Essential (primary) hypertension: Secondary | ICD-10-CM | POA: Diagnosis not present

## 2014-02-16 DIAGNOSIS — F259 Schizoaffective disorder, unspecified: Secondary | ICD-10-CM

## 2014-02-16 DIAGNOSIS — Y929 Unspecified place or not applicable: Secondary | ICD-10-CM | POA: Diagnosis not present

## 2014-02-16 DIAGNOSIS — J441 Chronic obstructive pulmonary disease with (acute) exacerbation: Secondary | ICD-10-CM

## 2014-02-16 DIAGNOSIS — R5383 Other fatigue: Secondary | ICD-10-CM

## 2014-02-16 DIAGNOSIS — F411 Generalized anxiety disorder: Secondary | ICD-10-CM | POA: Insufficient documentation

## 2014-02-16 DIAGNOSIS — F250 Schizoaffective disorder, bipolar type: Secondary | ICD-10-CM

## 2014-02-16 DIAGNOSIS — S0993XA Unspecified injury of face, initial encounter: Secondary | ICD-10-CM | POA: Diagnosis not present

## 2014-02-16 DIAGNOSIS — IMO0002 Reserved for concepts with insufficient information to code with codable children: Secondary | ICD-10-CM | POA: Diagnosis not present

## 2014-02-16 DIAGNOSIS — R5381 Other malaise: Secondary | ICD-10-CM

## 2014-02-16 DIAGNOSIS — W06XXXA Fall from bed, initial encounter: Secondary | ICD-10-CM | POA: Insufficient documentation

## 2014-02-16 DIAGNOSIS — S42209A Unspecified fracture of upper end of unspecified humerus, initial encounter for closed fracture: Secondary | ICD-10-CM | POA: Diagnosis not present

## 2014-02-16 LAB — BLOOD GAS, ARTERIAL
Acid-Base Excess: 10.2 mmol/L — ABNORMAL HIGH (ref 0.0–2.0)
Bicarbonate: 34.6 mEq/L — ABNORMAL HIGH (ref 20.0–24.0)
DRAWN BY: 25788
O2 Saturation: 88 %
PO2 ART: 53.4 mmHg — AB (ref 80.0–100.0)
Patient temperature: 37
TCO2: 31.4 mmol/L (ref 0–100)
pCO2 arterial: 50.2 mmHg — ABNORMAL HIGH (ref 35.0–45.0)
pH, Arterial: 7.453 — ABNORMAL HIGH (ref 7.350–7.450)

## 2014-02-16 LAB — CBC
HCT: 35.9 % — ABNORMAL LOW (ref 36.0–46.0)
HEMOGLOBIN: 11.9 g/dL — AB (ref 12.0–15.0)
MCH: 32.8 pg (ref 26.0–34.0)
MCHC: 33.1 g/dL (ref 30.0–36.0)
MCV: 98.9 fL (ref 78.0–100.0)
PLATELETS: 121 10*3/uL — AB (ref 150–400)
RBC: 3.63 MIL/uL — ABNORMAL LOW (ref 3.87–5.11)
RDW: 14.6 % (ref 11.5–15.5)
WBC: 10.7 10*3/uL — AB (ref 4.0–10.5)

## 2014-02-16 LAB — CBC WITH DIFFERENTIAL/PLATELET
Basophils Absolute: 0 10*3/uL (ref 0.0–0.1)
Basophils Relative: 0 % (ref 0–1)
EOS ABS: 0 10*3/uL (ref 0.0–0.7)
EOS PCT: 0 % (ref 0–5)
HCT: 36.6 % (ref 36.0–46.0)
Hemoglobin: 11.9 g/dL — ABNORMAL LOW (ref 12.0–15.0)
LYMPHS ABS: 1.7 10*3/uL (ref 0.7–4.0)
Lymphocytes Relative: 10 % — ABNORMAL LOW (ref 12–46)
MCH: 32.2 pg (ref 26.0–34.0)
MCHC: 32.5 g/dL (ref 30.0–36.0)
MCV: 98.9 fL (ref 78.0–100.0)
MONO ABS: 1.4 10*3/uL — AB (ref 0.1–1.0)
Monocytes Relative: 8 % (ref 3–12)
Neutro Abs: 13.8 10*3/uL — ABNORMAL HIGH (ref 1.7–7.7)
Neutrophils Relative %: 82 % — ABNORMAL HIGH (ref 43–77)
PLATELETS: 142 10*3/uL — AB (ref 150–400)
RBC: 3.7 MIL/uL — AB (ref 3.87–5.11)
RDW: 14.4 % (ref 11.5–15.5)
WBC: 16.9 10*3/uL — ABNORMAL HIGH (ref 4.0–10.5)

## 2014-02-16 LAB — BASIC METABOLIC PANEL
Anion gap: 7 (ref 5–15)
BUN: 23 mg/dL (ref 6–23)
CALCIUM: 8.5 mg/dL (ref 8.4–10.5)
CO2: 37 mEq/L — ABNORMAL HIGH (ref 19–32)
CREATININE: 0.69 mg/dL (ref 0.50–1.10)
Chloride: 101 mEq/L (ref 96–112)
GFR calc Af Amer: >90 mL/min (ref >90)
Glucose, Bld: 85 mg/dL (ref 70–99)
Potassium: 3.5 mEq/L — ABNORMAL LOW (ref 3.7–5.3)
SODIUM: 145 meq/L (ref 137–147)

## 2014-02-16 LAB — URINE MICROSCOPIC-ADD ON

## 2014-02-16 LAB — URINALYSIS, ROUTINE W REFLEX MICROSCOPIC
Bilirubin Urine: NEGATIVE
GLUCOSE, UA: NEGATIVE mg/dL
KETONES UR: NEGATIVE mg/dL
Leukocytes, UA: NEGATIVE
Nitrite: NEGATIVE
Protein, ur: NEGATIVE mg/dL
Specific Gravity, Urine: 1.01 (ref 1.005–1.030)
Urobilinogen, UA: 0.2 mg/dL (ref 0.0–1.0)
pH: 8 (ref 5.0–8.0)

## 2014-02-16 LAB — AMMONIA: AMMONIA: 46 umol/L (ref 11–60)

## 2014-02-16 LAB — CREATININE, SERUM
CREATININE: 0.69 mg/dL (ref 0.50–1.10)
GFR calc non Af Amer: >90 mL/min (ref >90)

## 2014-02-16 LAB — TROPONIN I: Troponin I: <0.3 ng/mL (ref <0.30)

## 2014-02-16 MED ORDER — VITAMIN C 500 MG PO TABS
500.0000 mg | ORAL_TABLET | Freq: Every day | ORAL | Status: DC
  Administered 2014-02-16 – 2014-02-19 (×4): 500 mg via ORAL
  Filled 2014-02-16 (×4): qty 1

## 2014-02-16 MED ORDER — ONDANSETRON HCL 4 MG PO TABS: 4.0000 mg | ORAL_TABLET | Freq: Four times a day (QID) | ORAL | Status: DC | PRN

## 2014-02-16 MED ORDER — OLANZAPINE 5 MG PO TABS
10.0000 mg | ORAL_TABLET | Freq: Every day | ORAL | Status: DC
  Administered 2014-02-16 – 2014-02-18 (×3): 10 mg via ORAL
  Filled 2014-02-16 (×3): qty 2

## 2014-02-16 MED ORDER — IPRATROPIUM BROMIDE 0.02 % IN SOLN: 0.5000 mg | Freq: Four times a day (QID) | RESPIRATORY_TRACT | Status: DC

## 2014-02-16 MED ORDER — DIVALPROEX SODIUM ER 500 MG PO TB24
1000.0000 mg | ORAL_TABLET | Freq: Every day | ORAL | Status: DC
  Administered 2014-02-16 – 2014-02-18 (×3): 1000 mg via ORAL
  Filled 2014-02-16 (×3): qty 2

## 2014-02-16 MED ORDER — SODIUM CHLORIDE 0.9 % IV SOLN: INTRAVENOUS | Status: DC

## 2014-02-16 MED ORDER — ALBUTEROL SULFATE (2.5 MG/3ML) 0.083% IN NEBU: 2.5000 mg | INHALATION_SOLUTION | RESPIRATORY_TRACT | Status: DC | PRN

## 2014-02-16 MED ORDER — ACETAMINOPHEN 650 MG RE SUPP: 650.0000 mg | Freq: Four times a day (QID) | RECTAL | Status: DC | PRN

## 2014-02-16 MED ORDER — BUDESONIDE-FORMOTEROL FUMARATE 160-4.5 MCG/ACT IN AERO
2.0000 | INHALATION_SPRAY | Freq: Two times a day (BID) | RESPIRATORY_TRACT | Status: DC
  Administered 2014-02-16 – 2014-02-19 (×6): 2 via RESPIRATORY_TRACT
  Filled 2014-02-16: qty 6

## 2014-02-16 MED ORDER — NICOTINE 21 MG/24HR TD PT24
21.0000 mg | MEDICATED_PATCH | Freq: Every day | TRANSDERMAL | Status: DC
  Administered 2014-02-16 – 2014-02-19 (×4): 21 mg via TRANSDERMAL
  Filled 2014-02-16 (×4): qty 1

## 2014-02-16 MED ORDER — TRAMADOL HCL 50 MG PO TABS
50.0000 mg | ORAL_TABLET | Freq: Once | ORAL | Status: DC
  Filled 2014-02-16: qty 1

## 2014-02-16 MED ORDER — ALBUTEROL SULFATE (2.5 MG/3ML) 0.083% IN NEBU: 2.5000 mg | INHALATION_SOLUTION | Freq: Four times a day (QID) | RESPIRATORY_TRACT | Status: DC

## 2014-02-16 MED ORDER — ENOXAPARIN SODIUM 40 MG/0.4ML ~~LOC~~ SOLN
40.0000 mg | SUBCUTANEOUS | Status: DC
  Administered 2014-02-16 – 2014-02-18 (×3): 40 mg via SUBCUTANEOUS
  Filled 2014-02-16 (×3): qty 0.4

## 2014-02-16 MED ORDER — AMLODIPINE BESYLATE 5 MG PO TABS
5.0000 mg | ORAL_TABLET | Freq: Every day | ORAL | Status: DC
  Administered 2014-02-16 – 2014-02-19 (×3): 5 mg via ORAL
  Filled 2014-02-16 (×4): qty 1

## 2014-02-16 MED ORDER — IPRATROPIUM-ALBUTEROL 0.5-2.5 (3) MG/3ML IN SOLN
3.0000 mL | Freq: Four times a day (QID) | RESPIRATORY_TRACT | Status: DC
  Administered 2014-02-16: 3 mL via RESPIRATORY_TRACT
  Filled 2014-02-16: qty 3

## 2014-02-16 MED ORDER — IPRATROPIUM-ALBUTEROL 0.5-2.5 (3) MG/3ML IN SOLN: 3.0000 mL | Freq: Three times a day (TID) | RESPIRATORY_TRACT | Status: DC

## 2014-02-16 MED ORDER — ACETAMINOPHEN 500 MG PO TABS
1000.0000 mg | ORAL_TABLET | Freq: Once | ORAL | Status: AC
  Administered 2014-02-16: 1000 mg via ORAL
  Filled 2014-02-16: qty 2

## 2014-02-16 MED ORDER — ONDANSETRON HCL 4 MG/2ML IJ SOLN: 4.0000 mg | Freq: Four times a day (QID) | INTRAMUSCULAR | Status: DC | PRN

## 2014-02-16 MED ORDER — SODIUM CHLORIDE 0.9 % IJ SOLN: 3.0000 mL | Freq: Two times a day (BID) | INTRAMUSCULAR | Status: DC

## 2014-02-16 MED ORDER — VITAMIN B-12 1000 MCG PO TABS
1000.0000 ug | ORAL_TABLET | Freq: Every day | ORAL | Status: DC
  Administered 2014-02-16 – 2014-02-19 (×4): 1000 ug via ORAL
  Filled 2014-02-16 (×4): qty 1

## 2014-02-16 MED ORDER — FESOTERODINE FUMARATE ER 4 MG PO TB24
4.0000 mg | ORAL_TABLET | Freq: Every day | ORAL | Status: DC
  Administered 2014-02-16 – 2014-02-19 (×4): 4 mg via ORAL
  Filled 2014-02-16 (×5): qty 1

## 2014-02-16 MED ORDER — SODIUM CHLORIDE 0.9 % IV BOLUS (SEPSIS)
500.0000 mL | Freq: Once | INTRAVENOUS | Status: AC
  Administered 2014-02-16: 500 mL via INTRAVENOUS

## 2014-02-16 MED ORDER — PAROXETINE HCL 20 MG PO TABS
40.0000 mg | ORAL_TABLET | Freq: Every day | ORAL | Status: DC
  Administered 2014-02-16 – 2014-02-18 (×3): 40 mg via ORAL
  Filled 2014-02-16 (×3): qty 2

## 2014-02-16 MED ORDER — FESOTERODINE FUMARATE ER 4 MG PO TB24
ORAL_TABLET | ORAL | Status: AC
  Filled 2014-02-16: qty 1

## 2014-02-16 MED ORDER — PREGABALIN 50 MG PO CAPS
50.0000 mg | ORAL_CAPSULE | Freq: Every day | ORAL | Status: DC
  Administered 2014-02-16 – 2014-02-18 (×3): 50 mg via ORAL
  Filled 2014-02-16 (×3): qty 1

## 2014-02-16 MED ORDER — ATORVASTATIN CALCIUM 20 MG PO TABS
20.0000 mg | ORAL_TABLET | Freq: Every day | ORAL | Status: DC
  Administered 2014-02-16 – 2014-02-18 (×3): 20 mg via ORAL
  Filled 2014-02-16 (×3): qty 1

## 2014-02-16 MED ORDER — POTASSIUM CHLORIDE IN NACL 20-0.9 MEQ/L-% IV SOLN
INTRAVENOUS | Status: DC
  Administered 2014-02-16: 75 mL via INTRAVENOUS
  Administered 2014-02-17 – 2014-02-19 (×4): via INTRAVENOUS

## 2014-02-16 MED ORDER — ACETAMINOPHEN 325 MG PO TABS
650.0000 mg | ORAL_TABLET | Freq: Four times a day (QID) | ORAL | Status: DC | PRN
  Administered 2014-02-16 – 2014-02-17 (×3): 650 mg via ORAL
  Filled 2014-02-16 (×4): qty 2

## 2014-02-16 MED ORDER — METHYLPHENIDATE HCL 5 MG PO TABS
20.0000 mg | ORAL_TABLET | Freq: Three times a day (TID) | ORAL | Status: DC
  Administered 2014-02-17 – 2014-02-19 (×6): 20 mg via ORAL
  Filled 2014-02-16 (×6): qty 4

## 2014-02-16 MED ORDER — CALCIUM CARBONATE-VITAMIN D 500-200 MG-UNIT PO TABS
1.0000 | ORAL_TABLET | Freq: Two times a day (BID) | ORAL | Status: DC
  Administered 2014-02-16 – 2014-02-19 (×6): 1 via ORAL
  Filled 2014-02-16 (×6): qty 1

## 2014-02-16 NOTE — ED Notes (Addendum)
Contacted Melinda Wood photographer at [employer] and reported wanted to speak with pt and for EDP to place order for RN Home Evaluation. Pt spoke with case manager and verified contact information. [name] reported would set-up home eval. Contacted [name],from social work.Verdon Cummins reported would review pt chart and call RN back in approximately 30 minutes. EDP aware. No new orders given.

## 2014-02-16 NOTE — ED Notes (Signed)
Pt easily arousable but drowsy during conversation with Case Manager via phone.

## 2014-02-16 NOTE — ED Notes (Signed)
Attempted to call report, nurse to call pt, she was in a pt's room

## 2014-02-16 NOTE — ED Notes (Signed)
Ambulate with 2 assist. Unsteady gait.

## 2014-02-16 NOTE — Care Management (Addendum)
Cm reeived call from [facility] RN requesting HHRN to be arranged for pt who was discharged from [facility] and had returned to the ED within 48 hours.  CM requestred MD to place [facility] for eval order and HHSW.  CM spoke with pt (on phone) who agreed these [facility] services would be of value and verified address and contact information with pt.  CM offered choice and notified [facility] with referral for [facility] for eval and HHSW.  No other CM needs were communicated.  Freddy Jaksch, BSN, CM [phone].

## 2014-02-16 NOTE — H&P (Signed)
[facility] History and Physical  Melinda Wood UEA:[id number] DOB: 12/25/1955 DOA: 02/16/2014  Referring physician: Dr. Lars Mage PCP: Isabella Stalling, MD   Chief Complaint: lethargy  HPI: Melinda Wood is a 58 y.o. female with history of COPD, schizoaffective disorder, who was discharged from the hospital on 9/25 after being treated for respiratory failure related to COPD. Unfortunately, she had to come back to the hospital the following day on 9/26 after she fell out of bed. The patient is somewhat lethargic at this time and details regarding the history is somewhat limited. The majority of the history is obtained from the medical record. Patient does wake up and answer some questions, but frequently falls asleep in conversation. Apparently, patient was rolling around in bed and fell off her bed. She reported that she did hit her head. She complains of diffuse body pain and is repeatedly asking for Percocet. There have been no fever, worsening shortness of breath, cough, dysuria, diarrhea or any other complaints. She was evaluated in the emergency room where imaging of her head and neck-any acute abnormalities. She was noted to be somewhat hypoxic at 88% on room air. The patient had difficulty ambulating and required two-person assist. She spent most of her time in the emergency room sleeping and would only awake when stimulated. She's been referred for observation.   Review of Systems:  Unable to assess due to lethargy  Past Medical History  Diagnosis Date  . High cholesterol   . Asthma   . Hypertension   . Bipolar 1 disorder   . Urinary incontinence   . Anxiety   . Panic attacks   . YNWGNFAO(130.8)    Past Surgical History  Procedure Laterality Date  . Shoulder surgery    . Appendectomy     Social History:  reports that she quit smoking about 2 weeks ago. Her smoking use included Cigarettes. She has a 5 pack-year smoking history. She has never used smokeless tobacco.  She reports that she does not drink alcohol or use illicit drugs.  Allergies  Allergen Reactions  . Arthrotec [Diclofenac-Misoprostol] Hives  . Sulfonamide Derivatives Hives    Family History: unable to assess due to mental status   Prior to Admission medications   Medication Sig Start Date End Date Taking? Authorizing Provider  albuterol (PROVENTIL) (2.5 MG/3ML) 0.083% nebulizer solution Take 3 mLs (2.5 mg total) by nebulization every 4 (four) hours as needed for wheezing or shortness of breath. 02/15/14  Yes Isabella Stalling, MD  amLODipine (NORVASC) 5 MG tablet Take 1 tablet (5 mg total) by mouth daily. For blood pressure control. 02/16/12  Yes Curlene Labrum Readling, MD  ATELVIA 35 MG TBEC Take 35 mg by mouth every 7 (seven) days. 07/04/13  Yes Historical Provider, MD  atorvastatin (LIPITOR) 20 MG tablet Take 1 tablet (20 mg total) by mouth at bedtime. For cholesterol control. 02/16/12  Yes Curlene Labrum Readling, MD  budesonide-formoterol (SYMBICORT) 160-4.5 MCG/ACT inhaler Inhale 2 puffs into the lungs 2 (two) times daily.   Yes Historical Provider, MD  calcium-vitamin D (OSCAL WITH D) 500-200 MG-UNIT per tablet Take 1 tablet by mouth 2 (two) times daily in the am and at bedtime.. For bone health. 02/16/12  Yes Curlene Labrum Readling, MD  diazepam (VALIUM) 5 MG tablet Take 5 mg by mouth 3 (three) times daily. 07/24/13  Yes Historical Provider, MD  divalproex (DEPAKOTE ER) 500 MG 24 hr tablet Take 2 tablets (1,000 mg total) by mouth at bedtime. For  mood stabilization. 02/16/12  Yes Curlene Labrum Readling, MD  fesoterodine (TOVIAZ) 4 MG TB24 Take 1 tablet (4 mg total) by mouth daily. Urinary incontinence 02/16/12  Yes Curlene Labrum Readling, MD  LYRICA 50 MG capsule Take 50 mg by mouth at bedtime. 01/15/14  Yes Historical Provider, MD  methylphenidate (RITALIN) 20 MG tablet Take 20 mg by mouth 3 (three) times daily. 08/06/13  Yes Historical Provider, MD  nicotine (NICODERM CQ - DOSED IN MG/24 HOURS) 21 mg/24hr patch Place 1  patch (21 mg total) onto the skin daily. 02/15/14  Yes Isabella Stalling, MD  OLANZapine (ZYPREXA) 10 MG tablet Take 1 tablet (10 mg total) by mouth at bedtime. For mood stabilization, sleep and clarity of thoughts. 02/16/12  Yes Ronny Bacon, MD  oxyCODONE-acetaminophen (PERCOCET/ROXICET) 5-325 MG per tablet Take 1 tablet by mouth 4 (four) times daily. 08/09/13  Yes Historical Provider, MD  PARoxetine (PAXIL) 40 MG tablet Take 1 tablet (40 mg total) by mouth daily. For depression and anxiety. 02/16/12  Yes Curlene Labrum Readling, MD  predniSONE (DELTASONE) 20 MG tablet Take 1 tablet (20 mg total) by mouth 2 (two) times daily with a meal. 02/15/14  Yes Isabella Stalling, MD  traZODone (DESYREL) 100 MG tablet Take 1 tablet (100 mg total) by mouth at bedtime. For sleep. 02/16/12  Yes Curlene Labrum Readling, MD  vitamin B-12 1000 MCG tablet Take 1 tablet (1,000 mcg total) by mouth daily. For nutritional supplementation. 02/16/12  Yes Curlene Labrum Readling, MD  vitamin C (VITAMIN C) 500 MG tablet Take 1 tablet (500 mg total) by mouth daily. For nutritional supplementation. 02/16/12  Yes Ronny Bacon, MD   Physical Exam: Filed Vitals:   02/16/14 1427 02/16/14 1428 02/16/14 1432 02/16/14 1734  BP:    120/70  Pulse: 66 62  52  Temp:    98.6 F (37 C)  TempSrc:    Oral  Resp: Height:     (1.626 m)  Weight:    65.772 kg (145 lb)  SpO2: 90% 95% 95% 97%    Wt Readings from Last 3 Encounters:  02/16/14 65.772 kg (145 lb)  02/06/14 61.236 kg (135 lb)  01/31/14 61.236 kg (135 lb)    General:  Sleeping on my arrival, no acute distress Eyes: PERRL, normal lids, irises & conjunctiva ENT: grossly normal hearing, lips & tongue Neck: no LAD, masses or thyromegaly Cardiovascular: RRR, no m/r/g. No LE edema. Telemetry: SR, no arrhythmias  Respiratory: CTA bilaterally, no w/r/r. Normal respiratory effort. Abdomen: soft, ntnd Skin: no rash or induration seen on limited exam Musculoskeletal: grossly  normal tone BUE/BLE Psychiatric: lethargic, does not engage in conversation Neurologic: grossly non-focal.          Labs on Admission:  Basic Metabolic Panel:  Recent Labs Lab 02/12/14 0540 02/16/14 0545  NA 143 145  K 4.9 3.5*  CL 95* 101  CO2 43* 37*  GLUCOSE 87 85  BUN 20 23  CREATININE 1.01 0.69  CALCIUM 9.2 8.5   Liver Function Tests: No results found for this basename: AST, ALT, ALKPHOS, BILITOT, PROT, ALBUMIN,  in the last 168 hours No results found for this basename: LIPASE, AMYLASE,  in the last 168 hours No results found for this basename: AMMONIA,  in the last 168 hours CBC:  Recent Labs Lab 02/12/14 0540 02/16/14 0545  WBC 8.7 16.9*  NEUTROABS 5.0 13.8*  HGB 12.1 11.9*  HCT 38.3 36.6  MCV 103.0*  98.9  PLT 210 142*   Cardiac Enzymes:  Recent Labs Lab 02/16/14 0545  TROPONINI <0.30    BNP (last 3 results) No results found for this basename: PROBNP,  in the last 8760 hours CBG: No results found for this basename: GLUCAP,  in the last 168 hours  Radiological Exams on Admission: Dg Chest 1 View  02/16/2014   CLINICAL DATA:  Fall and back pain.  EXAM: CHEST - 1 VIEW  COMPARISON:  02/11/2014  FINDINGS: Lungs are clear bilaterally. No evidence for a pneumothorax. Heart and mediastinum are within normal limits. The trachea is midline. Bony thorax is grossly intact.  IMPRESSION: No acute cardiopulmonary disease.   Electronically Signed   By: Richarda Overlie M.D.   On: 02/16/2014 07:48   Dg Lumbar Spine Complete  02/16/2014   CLINICAL DATA:  Fall 10 days ago.  Lower back pain.  EXAM: LUMBAR SPINE - COMPLETE 4+ VIEW  COMPARISON:  MRI 03/22/2006  FINDINGS: Normal alignment of the lumbar spine. There is disc space narrowing at L5-S1. The vertebral body heights are maintained. Degenerative facet arthropathy in the lower lumbar spine. No evidence for a pars defect. Degenerative endplate changes in lower thoracic spine. Large amount of stool in the abdomen and pelvis.   IMPRESSION: No acute bone abnormality in the lumbar spine.  Degenerative changes in lower lumbar spine as described.  Large stool burden.   Electronically Signed   By: Richarda Overlie M.D.   On: 02/16/2014 07:46   Ct Head Wo Contrast  02/16/2014   CLINICAL DATA:  59 year old with a fall 10 days ago. Slurred speech, altered mental status and upper back pain.  EXAM: CT HEAD WITHOUT CONTRAST  CT CERVICAL SPINE WITHOUT CONTRAST  TECHNIQUE: Multidetector CT imaging of the head and cervical spine was performed following the standard protocol without intravenous contrast. Multiplanar CT image reconstructions of the cervical spine were also generated.  COMPARISON:  05/23/2010  FINDINGS: CT HEAD FINDINGS  There is subtle low-density in the periventricular white matter suggesting chronic small vessel ischemic changes. No evidence for acute hemorrhage, mass lesion, midline shift, hydrocephalus or large infarct. There is a small amount of fluid in the left mastoid air cells. There is frothy material in the sphenoid sinuses and posterior right ethmoid air cells. No calvarial fracture.  CT CERVICAL SPINE FINDINGS  Small amount of fluid in left mastoid air cells. Mandibular condyles are located. Stable sclerosis involving the right mandibular condyle. Small amount of mucosal disease in the sphenoid sinuses. Degenerative facet disease, particularly on the right side. No evidence for a fracture or dislocation. Negative for an apical pneumothorax. Right foraminal narrowing at C3-C4 related to the facet hypertrophy. Right foraminal narrowing at C4-C5. Bilateral foraminal narrowing at C5-C6. Prominent disc osteophyte complex at C6-C7 with marked left foraminal narrowing. Thyroid tissue is mildly heterogeneous with some calcifications. Disc space narrowing at C6-C7 with a disc osteophyte complex. The vertebral body heights are maintained. No evidence for soft tissue swelling in the neck.  IMPRESSION: No acute intracranial abnormality.   Low density in the periventricular white matter suggests chronic small vessel ischemic changes.  Mild paranasal sinus disease and a small amount of fluid in left mastoid air cells.  No acute bone abnormality in the cervical spine.  Multilevel degenerative disease in the cervical spine, most prominent at C6-C7.   Electronically Signed   By: Richarda Overlie M.D.   On: 02/16/2014 07:24   Ct Cervical Spine Wo Contrast  02/16/2014  CLINICAL DATA:  58 year old with a fall 10 days ago. Slurred speech, altered mental status and upper back pain.  EXAM: CT HEAD WITHOUT CONTRAST  CT CERVICAL SPINE WITHOUT CONTRAST  TECHNIQUE: Multidetector CT imaging of the head and cervical spine was performed following the standard protocol without intravenous contrast. Multiplanar CT image reconstructions of the cervical spine were also generated.  COMPARISON:  05/23/2010  FINDINGS: CT HEAD FINDINGS  There is subtle low-density in the periventricular white matter suggesting chronic small vessel ischemic changes. No evidence for acute hemorrhage, mass lesion, midline shift, hydrocephalus or large infarct. There is a small amount of fluid in the left mastoid air cells. There is frothy material in the sphenoid sinuses and posterior right ethmoid air cells. No calvarial fracture.  CT CERVICAL SPINE FINDINGS  Small amount of fluid in left mastoid air cells. Mandibular condyles are located. Stable sclerosis involving the right mandibular condyle. Small amount of mucosal disease in the sphenoid sinuses. Degenerative facet disease, particularly on the right side. No evidence for a fracture or dislocation. Negative for an apical pneumothorax. Right foraminal narrowing at C3-C4 related to the facet hypertrophy. Right foraminal narrowing at C4-C5. Bilateral foraminal narrowing at C5-C6. Prominent disc osteophyte complex at C6-C7 with marked left foraminal narrowing. Thyroid tissue is mildly heterogeneous with some calcifications. Disc space narrowing  at C6-C7 with a disc osteophyte complex. The vertebral body heights are maintained. No evidence for soft tissue swelling in the neck.  IMPRESSION: No acute intracranial abnormality.  Low density in the periventricular white matter suggests chronic small vessel ischemic changes.  Mild paranasal sinus disease and a small amount of fluid in left mastoid air cells.  No acute bone abnormality in the cervical spine.  Multilevel degenerative disease in the cervical spine, most prominent at C6-C7.   Electronically Signed   By: Adam  Henn M.D.   On: 02/16/2014 07:24    EKG: Independently reviewed. No acute changes  Assessment/Plan Principal Problem:   Lethargy Active Problems:   Schizoaffective disorder, bipolar type   HTN (hypertension)   Tobacco use disorder   Hypoxia   COPD (chronic obstructive pulmonary disease) with emphysema   1. Lethargy. The patient is on benzodiazepines as well as opiates. This may be medication related. She denies taking extra medications. She is also on her current Depakote which can also cause sedation. We'll hold benzos and opiates for now, until she is more awake. Other possibilities include hypoxia which could have caused lethargy. The patient was noted to be 88% on room air until oxygen was applied. Otherwise imaging is unremarkable. We will check ammonia and TSH. Urinalysis and chest x-ray are also unremarkable. 2. COPD. Appears stable at this time. She's not short of breath or wheezing. Continue bronchodilators. 3. Leukocytosis. No fever or evidence of infection. She was taking steroids during her last hospital stay. 4. Hypertension. Continue outpatient regimen 5. Schizoaffective disorder. Continue psychotropics. 6. Tobacco use. Continue nicotine patch.   Code Status: full code DVT Prophylaxis: lovenox Family Communication: no family present Disposition Plan: pending hospital course  Time spent: <MEASURE T>[facility]<MEASUREMENT Lu LLupita RaiderANZEB [facility] Pager  [phone]

## 2014-02-16 NOTE — ED Provider Notes (Addendum)
Pt left at change of shift to get UA and to have case manager/social worker evaluate patient. Per nursing staff patient was unsteady on her feet when they ambulated patient. Her UA has returned and is benign. Pt reported she has daily home health coming to her house.   09:45 nurses state care management has talked to patient and recommends home health evaluation (? I thought she already had that arranged), waiting for social services to consult.   12:00 nurses state social worker has deferred to case management. When I go in to wake up patient she's sleeping soundly with pulse ox 88%. She is difficult to wake up. Rectal temp and ABG was ordered. Nurses report her home health aide has come to visit patient in ED.  12:30 the case manager called. She states patient was able to speak to her in sentences when she talked to her earlier.  14:00 Pt asleep as shown in photo. Pulse ox 89% on RA. Pt is sleeping, hard to arouse. ABG reviewed and she has hypoxia. Patient has been in the ED over 9 hours. Pt is still very sleepy although patient is noted to be in her own clothing. I am not sure she's been taking her own pills. Patient had CT of her head done when she was first seen earlier today.Pt is not able to be discharged home alone.   14:15 Dr Kerry Hough, admit to obs to Dr Janna Arch. Put on continuous pulse oximetry.         Urinalysis    Component Value Date/Time   COLORURINE YELLOW 02/16/2014 0733   APPEARANCEUR HAZY* 02/16/2014 0733   LABSPEC 1.010 02/16/2014 0733   PHURINE 8.0 02/16/2014 0733   GLUCOSEU NEGATIVE 02/16/2014 0733   HGBUR TRACE* 02/16/2014 0733   BILIRUBINUR NEGATIVE 02/16/2014 0733   KETONESUR NEGATIVE 02/16/2014 0733   PROTEINUR NEGATIVE 02/16/2014 0733   UROBILINOGEN 0.2 02/16/2014 0733   NITRITE NEGATIVE 02/16/2014 0733   LEUKOCYTESUR NEGATIVE 02/16/2014 0733    Arterial Blood Gas result:  pO2 53.4; pCO2 50.2; pH 7.453;  HCO3 34.56, %O2 Sat 88%  Compensated respiratory acidosis with  hypoxia  Diagnoses that have been ruled out:  None  Diagnoses that are still under consideration:  None  Final diagnoses:  Lethargy  Hypoxia     Plan admission   Devoria Albe, MD, Franz Dell, MD 02/16/14 1423  Ward Givens, MD 02/16/14 1556

## 2014-02-16 NOTE — ED Provider Notes (Signed)
CSN: [id number]     Arrival date & time 02/16/14  0508 History   First MD Initiated Contact with Patient 02/16/14 0515     Chief Complaint  Patient presents with  . Fall     (Consider location/radiation/quality/duration/timing/severity/associated sxs/prior Treatment) HPI Comments: 58 year old female with history of high blood pressure, schizoaffective disorder, COPD, smoker, recent hospitalization for pneumonia/COPD presents after a fall. Patient still generally weak since discharge recently and tonight rolled out of her bed and hitting her head onto side table as, brief loss of consciousness. Patient says she lives alone. Patient has mild low back pain worse with movement. Into the nose blood in the stools or worsening cough or shortness of breath. Patient said similar cough since discharge.  Patient is a 58 y.o. female presenting with fall. The history is provided by the patient.  Fall Associated symptoms include headaches. Pertinent negatives include no chest pain, no abdominal pain and no shortness of breath.    Past Medical History  Diagnosis Date  . High cholesterol   . Asthma   . Hypertension   . Bipolar 1 disorder   . Urinary incontinence   . Anxiety   . Panic attacks   . XHBZJIRC(789.3)    Past Surgical History  Procedure Laterality Date  . Shoulder surgery    . Appendectomy     History reviewed. No pertinent family history. History  Substance Use Topics  . Smoking status: Former Smoker -- 0.50 packs/day for 10 years    Types: Cigarettes    Quit date: 02/02/2014  . Smokeless tobacco: Never Used  . Alcohol Use: No   OB History   Grav Para Term Preterm Abortions TAB SAB Ect Mult Living   Review of Systems  Constitutional: Positive for fatigue. Negative for fever and chills.  HENT: Negative for congestion.   Eyes: Negative for visual disturbance.  Respiratory: Negative for shortness of breath.   Cardiovascular: Negative for chest pain.   Gastrointestinal: Negative for vomiting and abdominal pain.  Genitourinary: Negative for dysuria and flank pain.  Musculoskeletal: Positive for arthralgias and neck pain. Negative for back pain and neck stiffness.  Skin: Negative for rash.  Neurological: Positive for syncope, weakness and headaches. Negative for light-headedness. Tremors: general.      Allergies  Arthrotec and Sulfonamide derivatives  Home Medications   Prior to Admission medications   Medication Sig Start Date End Date Taking? Authorizing Provider  albuterol (PROVENTIL) (2.5 MG/3ML) 0.083% nebulizer solution Take 3 mLs (2.5 mg total) by nebulization every 4 (four) hours as needed for wheezing or shortness of breath. 02/15/14  Yes Isabella Stalling, MD  amLODipine (NORVASC) 5 MG tablet Take 1 tablet (5 mg total) by mouth daily. For blood pressure control. 02/16/12  Yes Curlene Labrum Readling, MD  ATELVIA 35 MG TBEC Take 35 mg by mouth every 7 (seven) days. 07/04/13  Yes Historical Provider, MD  atorvastatin (LIPITOR) 20 MG tablet Take 1 tablet (20 mg total) by mouth at bedtime. For cholesterol control. 02/16/12  Yes Curlene Labrum Readling, MD  budesonide-formoterol (SYMBICORT) 160-4.5 MCG/ACT inhaler Inhale 2 puffs into the lungs 2 (two) times daily.   Yes Historical Provider, MD  calcium-vitamin D (OSCAL WITH D) 500-200 MG-UNIT per tablet Take 1 tablet by mouth 2 (two) times daily in the am and at bedtime.. For bone health. 02/16/12  Yes Curlene Labrum Readling, MD  diazepam (VALIUM) 5 MG tablet Take  5 mg by mouth 3 (three) times daily. 07/24/13  Yes Historical Provider, MD  divalproex (DEPAKOTE ER) 500 MG 24 hr tablet Take 2 tablets (1,000 mg total) by mouth at bedtime. For mood stabilization. 02/16/12  Yes Curlene Labrum Readling, MD  fesoterodine (TOVIAZ) 4 MG TB24 Take 1 tablet (4 mg total) by mouth daily. Urinary incontinence 02/16/12  Yes Ronny Bacon, MD  methylphenidate (RITALIN) 20 MG tablet Take 20 mg by mouth 3 (three) times daily. 08/06/13   Yes Historical Provider, MD  nicotine (NICODERM CQ - DOSED IN MG/24 HOURS) 21 mg/24hr patch Place 1 patch (21 mg total) onto the skin daily. 02/15/14  Yes Isabella Stalling, MD  OLANZapine (ZYPREXA) 10 MG tablet Take 1 tablet (10 mg total) by mouth at bedtime. For mood stabilization, sleep and clarity of thoughts. 02/16/12  Yes Ronny Bacon, MD  oxyCODONE-acetaminophen (PERCOCET/ROXICET) 5-325 MG per tablet Take 1 tablet by mouth 4 (four) times daily. 08/09/13  Yes Historical Provider, MD  PARoxetine (PAXIL) 40 MG tablet Take 1 tablet (40 mg total) by mouth daily. For depression and anxiety. 02/16/12  Yes Curlene Labrum Readling, MD  predniSONE (DELTASONE) 20 MG tablet Take 1 tablet (20 mg total) by mouth 2 (two) times daily with a meal. 02/15/14  Yes Isabella Stalling, MD  traZODone (DESYREL) 100 MG tablet Take 1 tablet (100 mg total) by mouth at bedtime. For sleep. 02/16/12  Yes Curlene Labrum Readling, MD  vitamin B-12 1000 MCG tablet Take 1 tablet (1,000 mcg total) by mouth daily. For nutritional supplementation. 02/16/12  Yes Curlene Labrum Readling, MD  vitamin C (VITAMIN C) 500 MG tablet Take 1 tablet (500 mg total) by mouth daily. For nutritional supplementation. 02/16/12  Yes Curlene Labrum Readling, MD   BP 120/65  Pulse 70  Temp(Src) 98.1 F (36.7 C) (Oral)  Resp 16  Ht  (1.626 m)  Wt 135 lb (61.236 kg)  BMI 23.16 kg/m2  SpO2 93% Physical Exam  Nursing note and vitals reviewed. Constitutional: She is oriented to person, place, and time. She appears well-developed and well-nourished.  HENT:  Head: Normocephalic and atraumatic.  C. collar in place, neck supple.  Eyes: Right eye exhibits no discharge. Left eye exhibits no discharge.  Neck: Normal range of motion. Neck supple. No tracheal deviation present.  Cardiovascular: Normal rate and regular rhythm.   Pulmonary/Chest: Effort normal and breath sounds normal.  Abdominal: Soft. She exhibits no distension. There is no tenderness. There is no guarding.   Musculoskeletal: She exhibits no edema.  Patient has mild paraspinal tenderness lower lumbar and mild midline tenderness lower lumbar. No thoracic tenderness.  Neurological: She is alert and oriented to person, place, and time. No cranial nerve deficit. GCS eye subscore is 4. GCS verbal subscore is 5. GCS motor subscore is 6.  Patient has mild general  weakness bilateral UE and LE Sensation grossly intact to palpation upper and lower extremities bilateral. Finger nose intact bilateral. Patient mild sleeping on exam but easily arousable to verbal discussion. No obvious arm drift.  Skin: Skin is warm. No rash noted.  Psychiatric: She has a normal mood and affect.    ED Course  Procedures (including critical care time) Labs Review Labs Reviewed  CBC WITH DIFFERENTIAL - Abnormal; Notable for the following:    WBC 16.9 (*)    RBC 3.70 (*)    Hemoglobin 11.9 (*)    Platelets 142 (*)    Neutrophils Relative % 82 (*)  Neutro Abs 13.8 (*)    Lymphocytes Relative 10 (*)    Monocytes Absolute 1.4 (*)    All other components within normal limits  BASIC METABOLIC PANEL  TROPONIN I  URINALYSIS, ROUTINE W REFLEX MICROSCOPIC    Imaging Review Dg Chest 1 View  02/16/2014   CLINICAL DATA:  Fall and back pain.  EXAM: CHEST - 1 VIEW  COMPARISON:  02/11/2014  FINDINGS: Lungs are clear bilaterally. No evidence for a pneumothorax. Heart and mediastinum are within normal limits. The trachea is midline. Bony thorax is grossly intact.  IMPRESSION: No acute cardiopulmonary disease.   Electronically Signed   By: Richarda Overlie M.D.   On: 02/16/2014 07:48   Dg Lumbar Spine Complete  02/16/2014   CLINICAL DATA:  Fall 10 days ago.  Lower back pain.  EXAM: LUMBAR SPINE - COMPLETE 4+ VIEW  COMPARISON:  MRI 03/22/2006  FINDINGS: Normal alignment of the lumbar spine. There is disc space narrowing at L5-S1. The vertebral body heights are maintained. Degenerative facet arthropathy in the lower lumbar spine. No evidence  for a pars defect. Degenerative endplate changes in lower thoracic spine. Large amount of stool in the abdomen and pelvis.  IMPRESSION: No acute bone abnormality in the lumbar spine.  Degenerative changes in lower lumbar spine as described.  Large stool burden.   Electronically Signed   By: Richarda Overlie M.D.   On: 02/16/2014 07:46   Ct Head Wo Contrast  02/16/2014   CLINICAL DATA:  58 year old with a fall 10 days ago. Slurred speech, altered mental status and upper back pain.  EXAM: CT HEAD WITHOUT CONTRAST  CT CERVICAL SPINE WITHOUT CONTRAST  TECHNIQUE: Multidetector CT imaging of the head and cervical spine was performed following the standard protocol without intravenous contrast. Multiplanar CT image reconstructions of the cervical spine were also generated.  COMPARISON:  05/23/2010  FINDINGS: CT HEAD FINDINGS  There is subtle low-density in the periventricular white matter suggesting chronic small vessel ischemic changes. No evidence for acute hemorrhage, mass lesion, midline shift, hydrocephalus or large infarct. There is a small amount of fluid in the left mastoid air cells. There is frothy material in the sphenoid sinuses and posterior right ethmoid air cells. No calvarial fracture.  CT CERVICAL SPINE FINDINGS  Small amount of fluid in left mastoid air cells. Mandibular condyles are located. Stable sclerosis involving the right mandibular condyle. Small amount of mucosal disease in the sphenoid sinuses. Degenerative facet disease, particularly on the right side. No evidence for a fracture or dislocation. Negative for an apical pneumothorax. Right foraminal narrowing at C3-C4 related to the facet hypertrophy. Right foraminal narrowing at C4-C5. Bilateral foraminal narrowing at C5-C6. Prominent disc osteophyte complex at C6-C7 with marked left foraminal narrowing. Thyroid tissue is mildly heterogeneous with some calcifications. Disc space narrowing at C6-C7 with a disc osteophyte complex. The vertebral body  heights are maintained. No evidence for soft tissue swelling in the neck.  IMPRESSION: No acute intracranial abnormality.  Low density in the periventricular white matter suggests chronic small vessel ischemic changes.  Mild paranasal sinus disease and a small amount of fluid in left mastoid air cells.  No acute bone abnormality in the cervical spine.  Multilevel degenerative disease in the cervical spine, most prominent at C6-C7.   Electronically Signed   By: Richarda Overlie M.D.   On: 02/16/2014 07:24   Ct Cervical Spine Wo Contrast  02/16/2014   CLINICAL DATA:  57 year old with a fall 10 days ago. Slurred speech,  altered mental status and upper back pain.  EXAM: CT HEAD WITHOUT CONTRAST  CT CERVICAL SPINE WITHOUT CONTRAST  TECHNIQUE: Multidetector CT imaging of the head and cervical spine was performed following the standard protocol without intravenous contrast. Multiplanar CT image reconstructions of the cervical spine were also generated.  COMPARISON:  05/23/2010  FINDINGS: CT HEAD FINDINGS  There is subtle low-density in the periventricular white matter suggesting chronic small vessel ischemic changes. No evidence for acute hemorrhage, mass lesion, midline shift, hydrocephalus or large infarct. There is a small amount of fluid in the left mastoid air cells. There is frothy material in the sphenoid sinuses and posterior right ethmoid air cells. No calvarial fracture.  CT CERVICAL SPINE FINDINGS  Small amount of fluid in left mastoid air cells. Mandibular condyles are located. Stable sclerosis involving the right mandibular condyle. Small amount of mucosal disease in the sphenoid sinuses. Degenerative facet disease, particularly on the right side. No evidence for a fracture or dislocation. Negative for an apical pneumothorax. Right foraminal narrowing at C3-C4 related to the facet hypertrophy. Right foraminal narrowing at C4-C5. Bilateral foraminal narrowing at C5-C6. Prominent disc osteophyte complex at C6-C7  with marked left foraminal narrowing. Thyroid tissue is mildly heterogeneous with some calcifications. Disc space narrowing at C6-C7 with a disc osteophyte complex. The vertebral body heights are maintained. No evidence for soft tissue swelling in the neck.  IMPRESSION: No acute intracranial abnormality.  Low density in the periventricular white matter suggests chronic small vessel ischemic changes.  Mild paranasal sinus disease and a small amount of fluid in left mastoid air cells.  No acute bone abnormality in the cervical spine.  Multilevel degenerative disease in the cervical spine, most prominent at C6-C7.   Electronically Signed   By: Richarda Overlie M.D.   On: 02/16/2014 07:24     EKG Interpretation   Date/Time:  Saturday February 16 2014 05:10:10 EDT Ventricular Rate:  70 PR Interval:  141 QRS Duration: 84 QT Interval:  448 QTC Calculation: 483 R Axis:   50 Text Interpretation:  Sinus rhythm Abnormal R-wave progression, early  transition Baseline wander in lead(s) V5 Confirmed by Janesia Joswick  MD, Lacara Dunsworth  ([id number]) on 02/16/2014 7:32:28 AM      MDM   Final diagnoses:  Lethargy  Hypoxia   Patient presents with general fatigue and weakness which led to a fall prior to arrival. Patient response was aeration for pneumonia repeat chest x-ray unremarkable, no fevers and patient not requiring oxygen. X-rays and CTs were reviewed no acute fracture from fall. Multiple rechecks in ER and patient at times would be sedate and then on second recheck would be asking for oral pain meds and alert and oriented x3. Patient signed out to followup urinalysis and reassess, social work/case manager assessment pending.     Enid Skeens, MD 02/18/14 [phone]

## 2014-02-16 NOTE — ED Notes (Signed)
Verdon Cummins from Social work called and reported that no needs for immediate placement were found. Verdon Cummins reported would contact Sarah at case management and discuss if home evaluation findings indicated the need for pt placement that social work would get involved at that time.

## 2014-02-16 NOTE — Clinical Social Work Note (Signed)
Clinical Social Worker received notification from RN that patient may require social work needs prior to discharge.  CSW spoke with CM who spoke with patient and states she has a stable living environment and was agreeable to home health RN and SW.  Clinical Social Worker will sign off for now as social work intervention is no longer needed. Please consult [place] again if new need arises.  Macario Golds, [place] [phone]

## 2014-02-16 NOTE — ED Notes (Signed)
respiratory called about blood gas.

## 2014-02-16 NOTE — Progress Notes (Signed)
Notified Dr. Kerry Hough tonight because the patient had requested her pain medication and something to eat.  MD stated that the patient could only eat if she was alert enough to eat and she should be supervised at this time while she ate.  I was in the room speaking with the patient while on the phone with MD the patient again stated that she wanted to eat, so the MD stated that she could eat only if alert.  Placed patient on heart healthy due to HTN and High Cholesterol.

## 2014-02-16 NOTE — Progress Notes (Signed)
Late entry for 02/15/14 at 2300. Patient's discharge instructions were discussed. IV removed and she was discharged in NAD via wheelchair.

## 2014-02-16 NOTE — ED Notes (Signed)
Per [facility], Case manager would be in to talk with pt around 8am.

## 2014-02-16 NOTE — ED Notes (Signed)
Patient states she "slid out of the bed and all of the covers were on her" patient states she hit her head on a night stand. Patient c/o "pain to her back and neck" patient states she was discharged yesterday from [facility]. Patient states she takes Percocet for pain, and is requesting percocet upon arrival to ED. pts speech is slurred, and grumbled, pupils are sluggish but reactive. A&OX4

## 2014-02-16 NOTE — ED Notes (Signed)
Patient placed in c-collar upon arrival to [facility] ED d/t complaint of fall with back/neck pain

## 2014-02-16 NOTE — ED Notes (Signed)
Pt in xray

## 2014-02-17 DIAGNOSIS — S42209A Unspecified fracture of upper end of unspecified humerus, initial encounter for closed fracture: Secondary | ICD-10-CM | POA: Diagnosis not present

## 2014-02-17 LAB — CBC
HEMATOCRIT: 32.4 % — AB (ref 36.0–46.0)
Hemoglobin: 10.6 g/dL — ABNORMAL LOW (ref 12.0–15.0)
MCH: 32.4 pg (ref 26.0–34.0)
MCHC: 32.7 g/dL (ref 30.0–36.0)
MCV: 99.1 fL (ref 78.0–100.0)
PLATELETS: 109 10*3/uL — AB (ref 150–400)
RBC: 3.27 MIL/uL — ABNORMAL LOW (ref 3.87–5.11)
RDW: 14.7 % (ref 11.5–15.5)
WBC: 10.5 10*3/uL (ref 4.0–10.5)

## 2014-02-17 LAB — BASIC METABOLIC PANEL
ANION GAP: 5 (ref 5–15)
BUN: 19 mg/dL (ref 6–23)
CALCIUM: 7.2 mg/dL — AB (ref 8.4–10.5)
CO2: 32 mEq/L (ref 19–32)
Chloride: 105 mEq/L (ref 96–112)
Creatinine, Ser: 0.63 mg/dL (ref 0.50–1.10)
GFR calc non Af Amer: >90 mL/min (ref >90)
Glucose, Bld: 78 mg/dL (ref 70–99)
Potassium: 3.3 mEq/L — ABNORMAL LOW (ref 3.7–5.3)
Sodium: 142 mEq/L (ref 137–147)

## 2014-02-17 LAB — TSH: TSH: 1.85 u[IU]/mL (ref 0.350–4.500)

## 2014-02-17 MED ORDER — OXYCODONE-ACETAMINOPHEN 5-325 MG PO TABS
1.0000 | ORAL_TABLET | Freq: Once | ORAL | Status: AC
  Administered 2014-02-17: 1 via ORAL
  Filled 2014-02-17: qty 1

## 2014-02-17 MED ORDER — IPRATROPIUM-ALBUTEROL 0.5-2.5 (3) MG/3ML IN SOLN: 3.0000 mL | RESPIRATORY_TRACT | Status: DC | PRN

## 2014-02-17 NOTE — Progress Notes (Signed)
Nutrition Brief Note  Patient identified on the Malnutrition Screening Tool (MST) Report   Hx of COPD, Schizoaffective disorder and tobacco abuse. Hx of asthma. Pt d/c 9/24 and presented to ED yesterday c/o lethargy. Recent PNA.  Body mass index is 24.88 kg/(m^2). Patient meets criteria for normal based on current BMI. Pt denies weight loss. Weight hx reviewed.  Home diet is regular. Current diet order is Heart healthy, pt is requesting food. She feeds herself.   No nutrition interventions warranted at this time. If nutrition issues arise, please consult RD.   Royann Shivers MS,RD,CSG,LDN Office: [phone] Pager: [phone]

## 2014-02-17 NOTE — Progress Notes (Signed)
UR completed 

## 2014-02-18 DIAGNOSIS — S42209A Unspecified fracture of upper end of unspecified humerus, initial encounter for closed fracture: Secondary | ICD-10-CM | POA: Diagnosis not present

## 2014-02-18 MED ORDER — OXYCODONE-ACETAMINOPHEN 5-325 MG PO TABS
1.0000 | ORAL_TABLET | Freq: Four times a day (QID) | ORAL | Status: DC | PRN
Start: 2014-02-18 — End: 2014-02-19
  Administered 2014-02-18 – 2014-02-19 (×5): 1 via ORAL
  Filled 2014-02-18 (×5): qty 1

## 2014-02-18 MED ORDER — PAROXETINE HCL 20 MG PO TABS
20.0000 mg | ORAL_TABLET | Freq: Every day | ORAL | Status: DC
  Administered 2014-02-19: 20 mg via ORAL
  Filled 2014-02-18: qty 1

## 2014-02-18 NOTE — Progress Notes (Signed)
UR chart review completed.  

## 2014-02-18 NOTE — Progress Notes (Signed)
Patient alert and oriented to place although and opioids or discontinued and results given by community mental health recently is a 4 times a day a chronic DD the N. DJD continue physical therapy Melinda Wood:[id number] DOB: 1956/02/19 DOA: 02/16/2014 PCP: Isabella Stalling, MD             Physical Exam: Blood pressure 145/77, pulse 60, temperature 98.6 F (37 C), temperature source Oral, resp. rate 16, height  (1.626 m), weight 145 lb (65.772 kg), SpO2 94.00%. on a sure no significant 3 weeks Soltero which are chronic power also. Heart regular rhythm no S3-S4   Investigations:  No results found for this or any previous visit (from the past 240 hour(s)).   Basic Metabolic Panel:  Recent Labs  81/19/14 0545 02/16/14 1824 02/17/14 0607  NA 145  --  142  K 3.5*  --  3.3*  CL 101  --  105  CO2 37*  --  32  GLUCOSE 85  --  78  BUN 23  --  19  CREATININE 0.69 0.69 0.63  CALCIUM 8.5  --  7.2*   Liver Function Tests: No results found for this basename: AST, ALT, ALKPHOS, BILITOT, PROT, ALBUMIN,  in the last 72 hours   CBC:  Recent Labs  02/16/14 0545 02/16/14 1824 02/17/14 0607  WBC 16.9* 10.7* 10.5  NEUTROABS 13.8*  --   --   HGB 11.9* 11.9* 10.6*  HCT 36.6 35.9* 32.4*  MCV 98.9 98.9 99.1  PLT 142* 121* 109*    No results found.    Medications:   Impression:  Principal Problem:   Lethargy Active Problems:   Schizoaffective disorder, bipolar type   HTN (hypertension)   Tobacco use disorder   Hypoxia   COPD (chronic obstructive pulmonary disease) with emphysema     Discontinued benzo diazepemes  Consultants: Physical therapy  Procedures   Antibiotics:                  Code Status:full   Family Communication:    Disposition Plan   Time spent30 min   LOS: 2 days   Melinda Wood M   02/18/2014, 1:13 PM

## 2014-02-18 NOTE — Progress Notes (Signed)
D/C telemetry. Patient has not had any episodes of abnormal rhythms.  She was in NSR when d/c'd.  This is done by order per protocol.

## 2014-02-18 NOTE — Progress Notes (Signed)
An attempt was made to see pt for a PT evaluation.  She was extremely drowsy and barely able to mumble.  I could not awaken her enough to get a clear picture of her functional status.  Will try again later.

## 2014-02-19 DIAGNOSIS — S42209A Unspecified fracture of upper end of unspecified humerus, initial encounter for closed fracture: Secondary | ICD-10-CM | POA: Diagnosis not present

## 2014-02-19 NOTE — Progress Notes (Signed)
Melinda Wood discharged home per MD order.  Discharge instructions reviewed and discussed with the patient and daughter at bedside, all questions and concerns answered. Copy of instructions and scripts given to patient.    Medication List    STOP taking these medications       diazepam 5 MG tablet  Commonly known as:  VALIUM      TAKE these medications       albuterol (2.5 MG/3ML) 0.083% nebulizer solution  Commonly known as:  PROVENTIL  Take 3 mLs (2.5 mg total) by nebulization every 4 (four) hours as needed for wheezing or shortness of breath.     amLODipine 5 MG tablet  Commonly known as:  NORVASC  Take 1 tablet (5 mg total) by mouth daily. For blood pressure control.     ascorbic acid 500 MG tablet  Commonly known as:  VITAMIN C  Take 1 tablet (500 mg total) by mouth daily. For nutritional supplementation.     ATELVIA 35 MG Tbec  Generic drug:  Risedronate Sodium  Take 35 mg by mouth every 7 (seven) days.     atorvastatin 20 MG tablet  Commonly known as:  LIPITOR  Take 1 tablet (20 mg total) by mouth at bedtime. For cholesterol control.     budesonide-formoterol 160-4.5 MCG/ACT inhaler  Commonly known as:  SYMBICORT  Inhale 2 puffs into the lungs 2 (two) times daily.     calcium-vitamin D 500-200 MG-UNIT per tablet  Commonly known as:  OSCAL WITH D  Take 1 tablet by mouth 2 (two) times daily in the am and at bedtime.. For bone health.     cyanocobalamin 1000 MCG tablet  Take 1 tablet (1,000 mcg total) by mouth daily. For nutritional supplementation.     divalproex 500 MG 24 hr tablet  Commonly known as:  DEPAKOTE ER  Take 2 tablets (1,000 mg total) by mouth at bedtime. For mood stabilization.     fesoterodine 4 MG Tb24 tablet  Commonly known as:  TOVIAZ  Take 1 tablet (4 mg total) by mouth daily. Urinary incontinence     LYRICA 50 MG capsule  Generic drug:  pregabalin  Take 50 mg by mouth at bedtime.     methylphenidate 20 MG tablet  Commonly known as:   RITALIN  Take 20 mg by mouth 3 (three) times daily.     nicotine 21 mg/24hr patch  Commonly known as:  NICODERM CQ - dosed in mg/24 hours  Place 1 patch (21 mg total) onto the skin daily.     OLANZapine 10 MG tablet  Commonly known as:  ZYPREXA  Take 1 tablet (10 mg total) by mouth at bedtime. For mood stabilization, sleep and clarity of thoughts.     oxyCODONE-acetaminophen 5-325 MG per tablet  Commonly known as:  PERCOCET/ROXICET  Take 1 tablet by mouth 4 (four) times daily.     PARoxetine 40 MG tablet  Commonly known as:  PAXIL  Take 1 tablet (40 mg total) by mouth daily. For depression and anxiety.     predniSONE 20 MG tablet  Commonly known as:  DELTASONE  Take 1 tablet (20 mg total) by mouth 2 (two) times daily with a meal.     traZODone 100 MG tablet  Commonly known as:  DESYREL  Take 1 tablet (100 mg total) by mouth at bedtime. For sleep.        Patients skin is clean, dry and intact, no evidence of skin break down. IV  site discontinued and catheter remains intact. Site without signs and symptoms of complications. Dressing and pressure applied.  Patient had a 70 count of 5mg  diazepam in pharmacy. MD ordered for patient to stop taking. Patient stated that she did not want to pick up the 70 count of 5mg  diazepam from pharmacy. Pharmacy was notified and will discard diazepam.  Patient escorted to car by Toni Amendourtney, NT in a wheelchair,  no distress noted upon discharge.  Melinda Wood, Melinda Wood 02/19/2014 2:45 PM

## 2014-02-19 NOTE — Discharge Summary (Signed)
Physician Discharge Summary  Melinda Wood:[id number] DOB: [birth year] DOA: 02/16/2014  PCP: Isabella Stalling, MD  Admit date: 02/16/2014 Discharge date: 02/19/2014   Recommendations for Outpatient Follow-up:  Followup in my office within one week's time to assess respiratory status as well as electrolytes Discharge Diagnoses:  Principal Problem:   Lethargy Active Problems:   Schizoaffective disorder, bipolar type   HTN (hypertension)   Tobacco use disorder   Hypoxia   COPD (chronic obstructive pulmonary disease) with emphysema   Discharge Condition: Good  Filed Weights   02/16/14 0511 02/16/14 1734  Weight: 135 lb (61.236 kg) 145 lb (65.772 kg)    History of present illness:  The patient was readmitted to hospital within 48 hours of discharge for COPD and community acquired pneumonia his chronic pain which take Percocet she was offered Valium while an inpatient and resumed Valium 5 mg daily to 3 times a day was found to be lethargic strippers readmitted because combined effects of multiple sedating medicines Valium was discontinued she is awake and oriented no metabolic or electrolyte disturbances patient had physical therapy and was rather stable to go home with no respiratory distress  Hospital Course:  The patient was cautioned not to take any diazepam which is given to her by community mental health  Procedures:    Consultations:  Physical therapy  Discharge Instructions  Discharge Instructions   Discharge instructions    Complete by:  As directed      Discharge patient    Complete by:  As directed      Face-to-face encounter (required for Medicare/Medicaid patients)    Complete by:  As directed   I Wood,Melinda L certify that this patient is under my care and that I, or a nurse practitioner or physician's assistant working with me, had a face-to-face encounter that meets the physician face-to-face encounter requirements with this patient on 02/16/2014. The  encounter with the patient was in whole, or in part for the following medical condition(s) which is the primary reason for home health care weakness  The encounter with the patient was in whole, or in part, for the following medical condition, which is the primary reason for home health care:  weakness  I certify that, based on my findings, the following services are medically necessary home health services:  Nursing  My clinical findings support the need for the above services:  Unsafe ambulation due to balance issues  Further, I certify that my clinical findings support that this patient is homebound due to:  Ambulates short distances less than 300 feet  Reason for Medically Necessary Home Health Services:  Skilled Nursing- Skilled Assessment/Observation     Home Health    Complete by:  As directed   To provide the following care/treatments:   RN Social work              Medication List    STOP taking these medications       diazepam 5 MG tablet  Commonly known as:  VALIUM      TAKE these medications       albuterol (2.5 MG/3ML) 0.083% nebulizer solution  Commonly known as:  PROVENTIL  Take 3 mLs (2.5 mg total) by nebulization every 4 (four) hours as needed for wheezing or shortness of breath.     amLODipine 5 MG tablet  Commonly known as:  NORVASC  Take 1 tablet (5 mg total) by mouth daily. For blood pressure control.     ascorbic acid  500 MG tablet  Commonly known as:  VITAMIN C  Take 1 tablet (500 mg total) by mouth daily. For nutritional supplementation.     ATELVIA 35 MG Tbec  Generic drug:  Risedronate Sodium  Take 35 mg by mouth every 7 (seven) days.     atorvastatin 20 MG tablet  Commonly known as:  LIPITOR  Take 1 tablet (20 mg total) by mouth at bedtime. For cholesterol control.     budesonide-formoterol 160-4.5 MCG/ACT inhaler  Commonly known as:  SYMBICORT  Inhale 2 puffs into the lungs 2 (two) times daily.     calcium-vitamin D 500-200 MG-UNIT per  tablet  Commonly known as:  OSCAL WITH D  Take 1 tablet by mouth 2 (two) times daily in the am and at bedtime.. For bone health.     cyanocobalamin 1000 MCG tablet  Take 1 tablet (1,000 mcg total) by mouth daily. For nutritional supplementation.     divalproex 500 MG 24 hr tablet  Commonly known as:  DEPAKOTE ER  Take 2 tablets (1,000 mg total) by mouth at bedtime. For mood stabilization.     fesoterodine 4 MG Tb24 tablet  Commonly known as:  TOVIAZ  Take 1 tablet (4 mg total) by mouth daily. Urinary incontinence     LYRICA 50 MG capsule  Generic drug:  pregabalin  Take 50 mg by mouth at bedtime.     methylphenidate 20 MG tablet  Commonly known as:  RITALIN  Take 20 mg by mouth 3 (three) times daily.     nicotine 21 mg/24hr patch  Commonly known as:  NICODERM CQ - dosed in mg/24 hours  Place 1 patch (21 mg total) onto the skin daily.     OLANZapine 10 MG tablet  Commonly known as:  ZYPREXA  Take 1 tablet (10 mg total) by mouth at bedtime. For mood stabilization, sleep and clarity of thoughts.     oxyCODONE-acetaminophen 5-325 MG per tablet  Commonly known as:  PERCOCET/ROXICET  Take 1 tablet by mouth 4 (four) times daily.     PARoxetine 40 MG tablet  Commonly known as:  PAXIL  Take 1 tablet (40 mg total) by mouth daily. For depression and anxiety.     predniSONE 20 MG tablet  Commonly known as:  DELTASONE  Take 1 tablet (20 mg total) by mouth 2 (two) times daily with a meal.     traZODone 100 MG tablet  Commonly known as:  DESYREL  Take 1 tablet (100 mg total) by mouth at bedtime. For sleep.       Allergies  Allergen Reactions  . Arthrotec [Diclofenac-Misoprostol] Hives  . Sulfonamide Derivatives Hives  . Ultram [Tramadol] Other (See Comments)    Patient reports taking this medication and having a seizure.       The results of significant diagnostics from this hospitalization (including imaging, microbiology, ancillary and laboratory) are listed below for  reference.    Significant Diagnostic Studies: Dg Chest 1 View  02/16/2014   CLINICAL DATA:  Fall and back pain.  EXAM: CHEST - 1 VIEW  COMPARISON:  02/11/2014  FINDINGS: Lungs are clear bilaterally. No evidence for a pneumothorax. Heart and mediastinum are within normal limits. The trachea is midline. Bony thorax is grossly intact.  IMPRESSION: No acute cardiopulmonary disease.   Electronically Signed   By: Richarda OverlieAdam  Henn M.D.   On: 02/16/2014 07:48   Dg Chest 2 View  02/11/2014   CLINICAL DATA:  Cough and weakness.  Left lower  lobe infiltrate.  EXAM: CHEST  2 VIEW  COMPARISON:  Radiographs 02/01/2014 and 02/06/2014.  FINDINGS: There is interval improved aeration of both lung bases. The left lower lobe airspace disease noted previously has resolved. There is linear subsegmental atelectasis or scarring in both lung bases. There is no edema or significant pleural effusion. The heart size and mediastinal contours are stable. The bones appear unremarkable. Telemetry leads overlie the chest.  IMPRESSION: Interval clearing of left lower lobe infiltrate. No acute cardiopulmonary process.   Electronically Signed   By: Roxy Horseman M.D.   On: 02/11/2014 15:42   Dg Chest 2 View  02/06/2014   CLINICAL DATA:  Cough for 1-2 weeks with hypoxia ; history of asthma and tobacco use  EXAM: CHEST  2 VIEW  COMPARISON:  PA and lateral chest x-ray of February 01, 2014  FINDINGS: The lungs are adequately inflated. There is increased density in the left lower lobe consistent with atelectasis or pneumonia. The heart and pulmonary vascularity are normal. The mediastinum is normal in width. There is no pleural effusion. The bony thorax is unremarkable.  IMPRESSION: Left lower lobe atelectasis or pneumonia. There is no evidence of CHF.   Electronically Signed   By: David  [place]   On: 02/06/2014 15:59   Dg Chest 2 View (if Patient Has Fever And/or Copd)  02/01/2014   CLINICAL DATA:  Shortness of breath, cough, chest pain  EXAM: CHEST   2 VIEW  COMPARISON:  01/01/2012  FINDINGS: Chronic interstitial markings/emphysematous changes. No focal consolidation. No pleural effusion or pneumothorax.  Heart is normal in size.  Degenerative changes of the visualized thoracolumbar spine. Status post ORIF of the left proximal humerus.  IMPRESSION: No evidence of acute cardiopulmonary disease.   Electronically Signed   By: Charline Bills M.D.   On: 02/01/2014 00:42   Dg Lumbar Spine Complete  02/16/2014   CLINICAL DATA:  Fall 10 days ago.  Lower back pain.  EXAM: LUMBAR SPINE - COMPLETE 4+ VIEW  COMPARISON:  MRI 03/22/2006  FINDINGS: Normal alignment of the lumbar spine. There is disc space narrowing at L5-S1. The vertebral body heights are maintained. Degenerative facet arthropathy in the lower lumbar spine. No evidence for a pars defect. Degenerative endplate changes in lower thoracic spine. Large amount of stool in the abdomen and pelvis.  IMPRESSION: No acute bone abnormality in the lumbar spine.  Degenerative changes in lower lumbar spine as described.  Large stool burden.   Electronically Signed   By: Richarda Overlie M.D.   On: 02/16/2014 07:46   Ct Head Wo Contrast  02/16/2014   CLINICAL DATA:  58 year old with a fall 10 days ago. Slurred speech, altered mental status and upper back pain.  EXAM: CT HEAD WITHOUT CONTRAST  CT CERVICAL SPINE WITHOUT CONTRAST  TECHNIQUE: Multidetector CT imaging of the head and cervical spine was performed following the standard protocol without intravenous contrast. Multiplanar CT image reconstructions of the cervical spine were also generated.  COMPARISON:  05/23/2010  FINDINGS: CT HEAD FINDINGS  There is subtle low-density in the periventricular white matter suggesting chronic small vessel ischemic changes. No evidence for acute hemorrhage, mass lesion, midline shift, hydrocephalus or large infarct. There is a small amount of fluid in the left mastoid air cells. There is frothy material in the sphenoid sinuses and  posterior right ethmoid air cells. No calvarial fracture.  CT CERVICAL SPINE FINDINGS  Small amount of fluid in left mastoid air cells. Mandibular condyles are located. Stable sclerosis  involving the right mandibular condyle. Small amount of mucosal disease in the sphenoid sinuses. Degenerative facet disease, particularly on the right side. No evidence for a fracture or dislocation. Negative for an apical pneumothorax. Right foraminal narrowing at C3-C4 related to the facet hypertrophy. Right foraminal narrowing at C4-C5. Bilateral foraminal narrowing at C5-C6. Prominent disc osteophyte complex at C6-C7 with marked left foraminal narrowing. Thyroid tissue is mildly heterogeneous with some calcifications. Disc space narrowing at C6-C7 with a disc osteophyte complex. The vertebral body heights are maintained. No evidence for soft tissue swelling in the neck.  IMPRESSION: No acute intracranial abnormality.  Low density in the periventricular white matter suggests chronic small vessel ischemic changes.  Mild paranasal sinus disease and a small amount of fluid in left mastoid air cells.  No acute bone abnormality in the cervical spine.  Multilevel degenerative disease in the cervical spine, most prominent at C6-C7.   Electronically Signed   By: Richarda Overlie M.D.   On: 02/16/2014 07:24   Ct Cervical Spine Wo Contrast  02/16/2014   CLINICAL DATA:  58 year old with a fall 10 days ago. Slurred speech, altered mental status and upper back pain.  EXAM: CT HEAD WITHOUT CONTRAST  CT CERVICAL SPINE WITHOUT CONTRAST  TECHNIQUE: Multidetector CT imaging of the head and cervical spine was performed following the standard protocol without intravenous contrast. Multiplanar CT image reconstructions of the cervical spine were also generated.  COMPARISON:  05/23/2010  FINDINGS: CT HEAD FINDINGS  There is subtle low-density in the periventricular white matter suggesting chronic small vessel ischemic changes. No evidence for acute  hemorrhage, mass lesion, midline shift, hydrocephalus or large infarct. There is a small amount of fluid in the left mastoid air cells. There is frothy material in the sphenoid sinuses and posterior right ethmoid air cells. No calvarial fracture.  CT CERVICAL SPINE FINDINGS  Small amount of fluid in left mastoid air cells. Mandibular condyles are located. Stable sclerosis involving the right mandibular condyle. Small amount of mucosal disease in the sphenoid sinuses. Degenerative facet disease, particularly on the right side. No evidence for a fracture or dislocation. Negative for an apical pneumothorax. Right foraminal narrowing at C3-C4 related to the facet hypertrophy. Right foraminal narrowing at C4-C5. Bilateral foraminal narrowing at C5-C6. Prominent disc osteophyte complex at C6-C7 with marked left foraminal narrowing. Thyroid tissue is mildly heterogeneous with some calcifications. Disc space narrowing at C6-C7 with a disc osteophyte complex. The vertebral body heights are maintained. No evidence for soft tissue swelling in the neck.  IMPRESSION: No acute intracranial abnormality.  Low density in the periventricular white matter suggests chronic small vessel ischemic changes.  Mild paranasal sinus disease and a small amount of fluid in left mastoid air cells.  No acute bone abnormality in the cervical spine.  Multilevel degenerative disease in the cervical spine, most prominent at C6-C7.   Electronically Signed   By: Richarda Overlie M.D.   On: 02/16/2014 07:24    Microbiology: No results found for this or any previous visit (from the past 240 hour(s)).   Labs: Basic Metabolic Panel:  Recent Labs Lab 02/16/14 0545 02/16/14 1824 02/17/14 0607  NA 145  --  142  K 3.5*  --  3.3*  CL 101  --  105  CO2 37*  --  32  GLUCOSE 85  --  78  BUN 23  --  19  CREATININE 0.69 0.69 0.63  CALCIUM 8.5  --  7.2*   Liver Function Tests: No results  found for this basename: AST, ALT, ALKPHOS, BILITOT, PROT,  ALBUMIN,  in the last 168 hours No results found for this basename: LIPASE, AMYLASE,  in the last 168 hours  Recent Labs Lab 02/16/14 1824  AMMONIA 46   CBC:  Recent Labs Lab 02/16/14 0545 02/16/14 1824 02/17/14 0607  WBC 16.9* 10.7* 10.5  NEUTROABS 13.8*  --   --   HGB 11.9* 11.9* 10.6*  HCT 36.6 35.9* 32.4*  MCV 98.9 98.9 99.1  PLT 142* 121* 109*   Cardiac Enzymes:  Recent Labs Lab 02/16/14 0545  TROPONINI <0.30   BNP: BNP (last 3 results) No results found for this basename: PROBNP,  in the last 8760 hours CBG: No results found for this basename: GLUCAP,  in the last 168 hours     Signed:  Isabella Stalling  [facility] Pager: [phone] 02/19/2014, 12:53 PM

## 2014-02-19 NOTE — Evaluation (Signed)
Physical Therapy Evaluation Patient Details Name: Melinda Wood MRN: [id number] DOB: [birth year] Today's Date: 02/19/2014   History of Present Illness  HPI: Melinda Wood is a 58 y.o. female with history of COPD, schizoaffective disorder, who was discharged from the hospital on 9/25 after being treated for respiratory failure related to COPD. Unfortunately, she had to come back to the hospital the following day on 9/26 after she fell out of bed. The patient is somewhat lethargic at this time and details regarding the history is somewhat limited. The majority of the history is obtained from the medical record. Patient does wake up and answer some questions, but frequently falls asleep in conversation. Apparently, patient was rolling around in bed and fell off her bed. She reported that she did hit her head. She complains of diffuse body pain and is repeatedly asking for Percocet. There have been no fever, worsening shortness of breath, cough, dysuria, diarrhea or any other complaints. She was evaluated in the emergency room where imaging of her head and neck-any acute abnormalities. She was noted to be somewhat hypoxic at 88% on room air. The patient had difficulty ambulating and required two-person assist. She spent most of her time in the emergency room sleeping and would only awake when stimulated. She's been referred for observation  Clinical Impression   Pt was seen for evaluation today.  She was alert and oriented, very cooperative at the time of my visit.  Her strength was WNL (except LUE secondary to old shoulder reconstruction).  She was found to have a minor balance deficit with high level balance and therefore she was instructed in the use of a cane.  She has a cane at home and reports that she feels more secure with the cane.  I would expect that this minor deficit will resolve as she returns to her normal activities.    Follow Up Recommendations No PT follow up    Equipment  Recommendations  None recommended by PT    Recommendations for Other Services    none   Precautions / Restrictions Precautions Precautions: None Restrictions Weight Bearing Restrictions: No      Mobility  Bed Mobility Overal bed mobility: Independent                Transfers Overall transfer level: Independent                  Ambulation/Gait Ambulation/Gait assistance: Modified independent (Device/Increase time) Ambulation Distance (Feet): 250 Feet Assistive device: None;Straight cane Gait Pattern/deviations: WFL(Within Functional Limits)   Gait velocity interpretation: at or above normal speed for age/gender General Gait Details: pt had one loss of balance when turning head side to side so it was decided that she would use a cane for maximal stability  Stairs            Wheelchair Mobility    Modified Rankin (Stroke Patients Only)       Balance Overall balance assessment: Needs assistance Sitting-balance support: No upper extremity supported;Feet supported Sitting balance-Leahy Scale: Normal     Standing balance support: No upper extremity supported Standing balance-Leahy Scale: Good                 High Level Balance Comments: pt had one minor loss of balance when turning head right during gait             Pertinent Vitals/Pain Pain Assessment: No/denies pain    Home Living Family/patient expects to be discharged to:: Private residence  Living Arrangements: Alone Available Help at Discharge: Home health;Neighbor Type of Home: Apartment Home Access: Level entry     Home Layout: One level Home Equipment: Cane - single point;Shower seat      Prior Function Level of Independence: Independent         Comments: Pt has aide 2 1/2 hours /day to assist with bathing, dressing, cleaning, etc.     Hand Dominance   Dominant Hand: Right    Extremity/Trunk Assessment   Upper Extremity Assessment: LUE deficits/detail        LUE Deficits / Details: limited mobility due to a shoulder reconstruction   Lower Extremity Assessment: Overall WFL for tasks assessed      Cervical / Trunk Assessment: Normal  Communication   Communication: No difficulties  Cognition Arousal/Alertness: Awake/alert Behavior During Therapy: WFL for tasks assessed/performed Overall Cognitive Status: Within Functional Limits for tasks assessed                      General Comments      Exercises        Assessment/Plan    PT Assessment Patent does not need any further PT services  PT Diagnosis     PT Problem List    PT Treatment Interventions     PT Goals (Current goals can be found in the Care Plan section) Acute Rehab PT Goals PT Goal Formulation: No goals set, d/c therapy    Frequency     Barriers to discharge  none      Co-evaluation               End of Session Equipment Utilized During Treatment: Gait belt Activity Tolerance: Patient tolerated treatment well Patient left: in bed;with call bell/phone within reach;with bed alarm set Nurse Communication: Mobility status    Functional Assessment Tool Used: clinical judgement Functional Limitation: Mobility: Walking and moving around Mobility: Walking and Moving Around Current Status ([id number]): At least 1 percent but less than 20 percent impaired, limited or restricted Mobility: Walking and Moving Around Goal Status [phone](G8979): At least 1 percent but less than 20 percent impaired, limited or restricted Mobility: Walking and Moving Around Discharge Status [phone](G8980): At least 1 percent but less than 20 percent impaired, limited or restricted    Time: 1019-1044 PT Time Calculation (min): 25 min   Charges:   PT Evaluation $Initial PT Evaluation Tier I: 1 Procedure     PT G Codes:   Functional Assessment Tool Used: clinical judgement Functional Limitation: Mobility: Walking and moving around    Des ArcBrown, Debarah Crapelaudia L 02/19/2014, 10:52 AM

## 2014-03-25 ENCOUNTER — Encounter (HOSPITAL_COMMUNITY): Payer: Self-pay | Admitting: Emergency Medicine

## 2014-06-24 DEATH — deceased

## 2015-09-20 IMAGING — CR DG WRIST COMPLETE 3+V*R*
3 series · 3 of 3 positions shown · non-contrast
Comparison: None.

CLINICAL DATA: Injury to the right wrist.

EXAM:
RIGHT WRIST - COMPLETE 3+ VIEW

[view not recorded (1 of 3)]
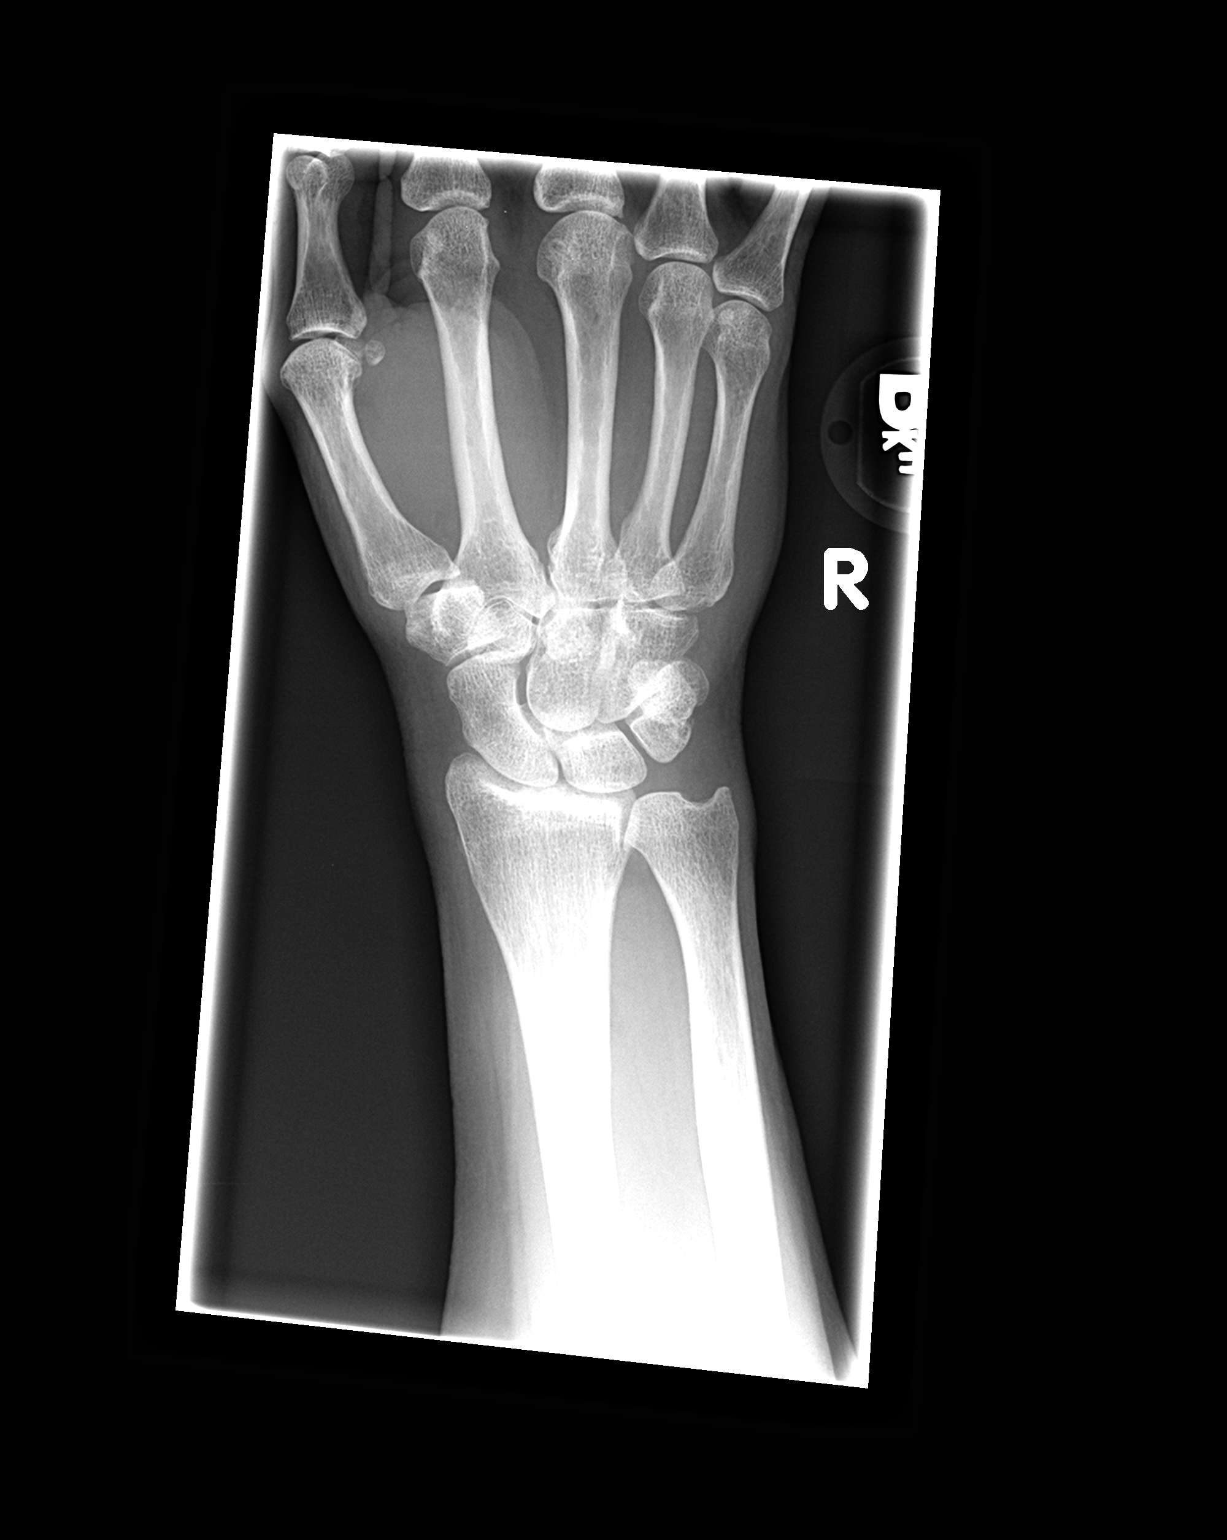

[view not recorded (2 of 3)]
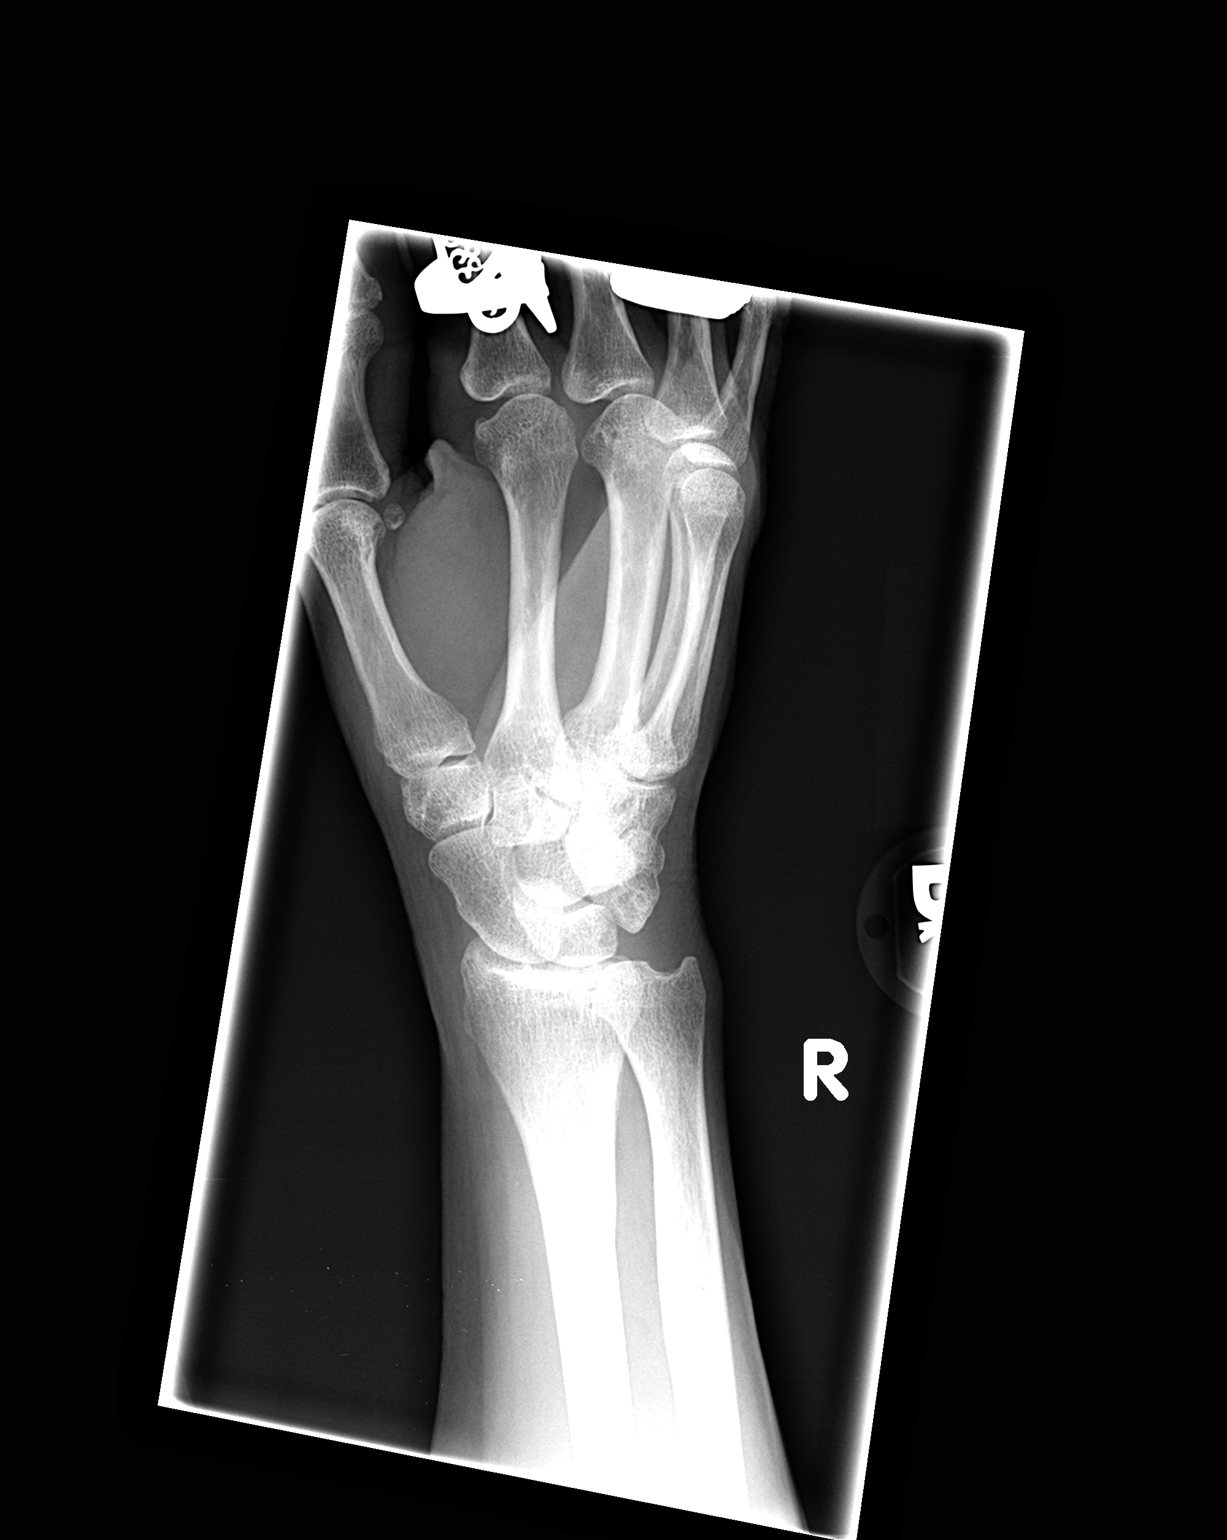

[view not recorded (3 of 3)]
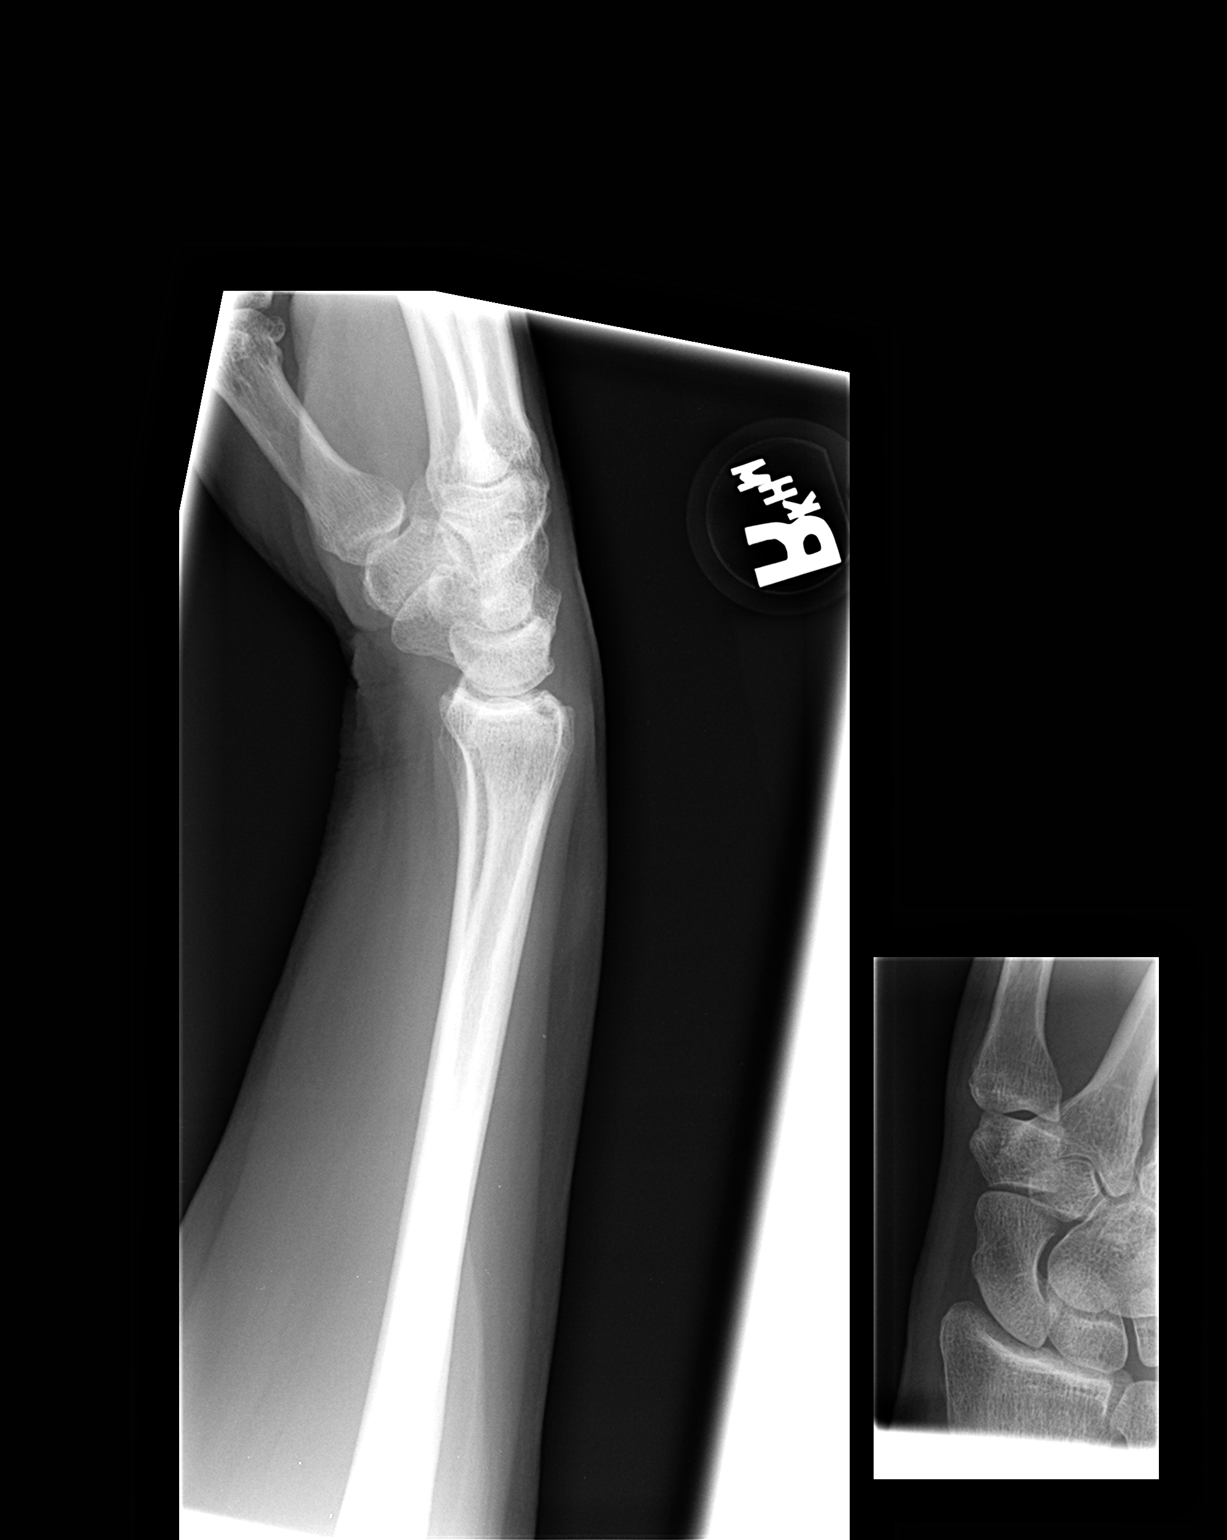

[3 of 3 positions shown; findings below may reference images not displayed]

FINDINGS: Negative for a fracture or dislocation. No significant soft tissue
swelling. The carpal bones are intact.
IMPRESSION: No acute bone abnormality.

## 2016-03-05 IMAGING — CR DG CHEST 2V
2 series · 2 of 2 positions shown · non-contrast
Comparison: 01/01/2012

CLINICAL DATA: Shortness of breath, cough, chest pain

EXAM:
CHEST  2 VIEW

[view not recorded (1 of 2)]
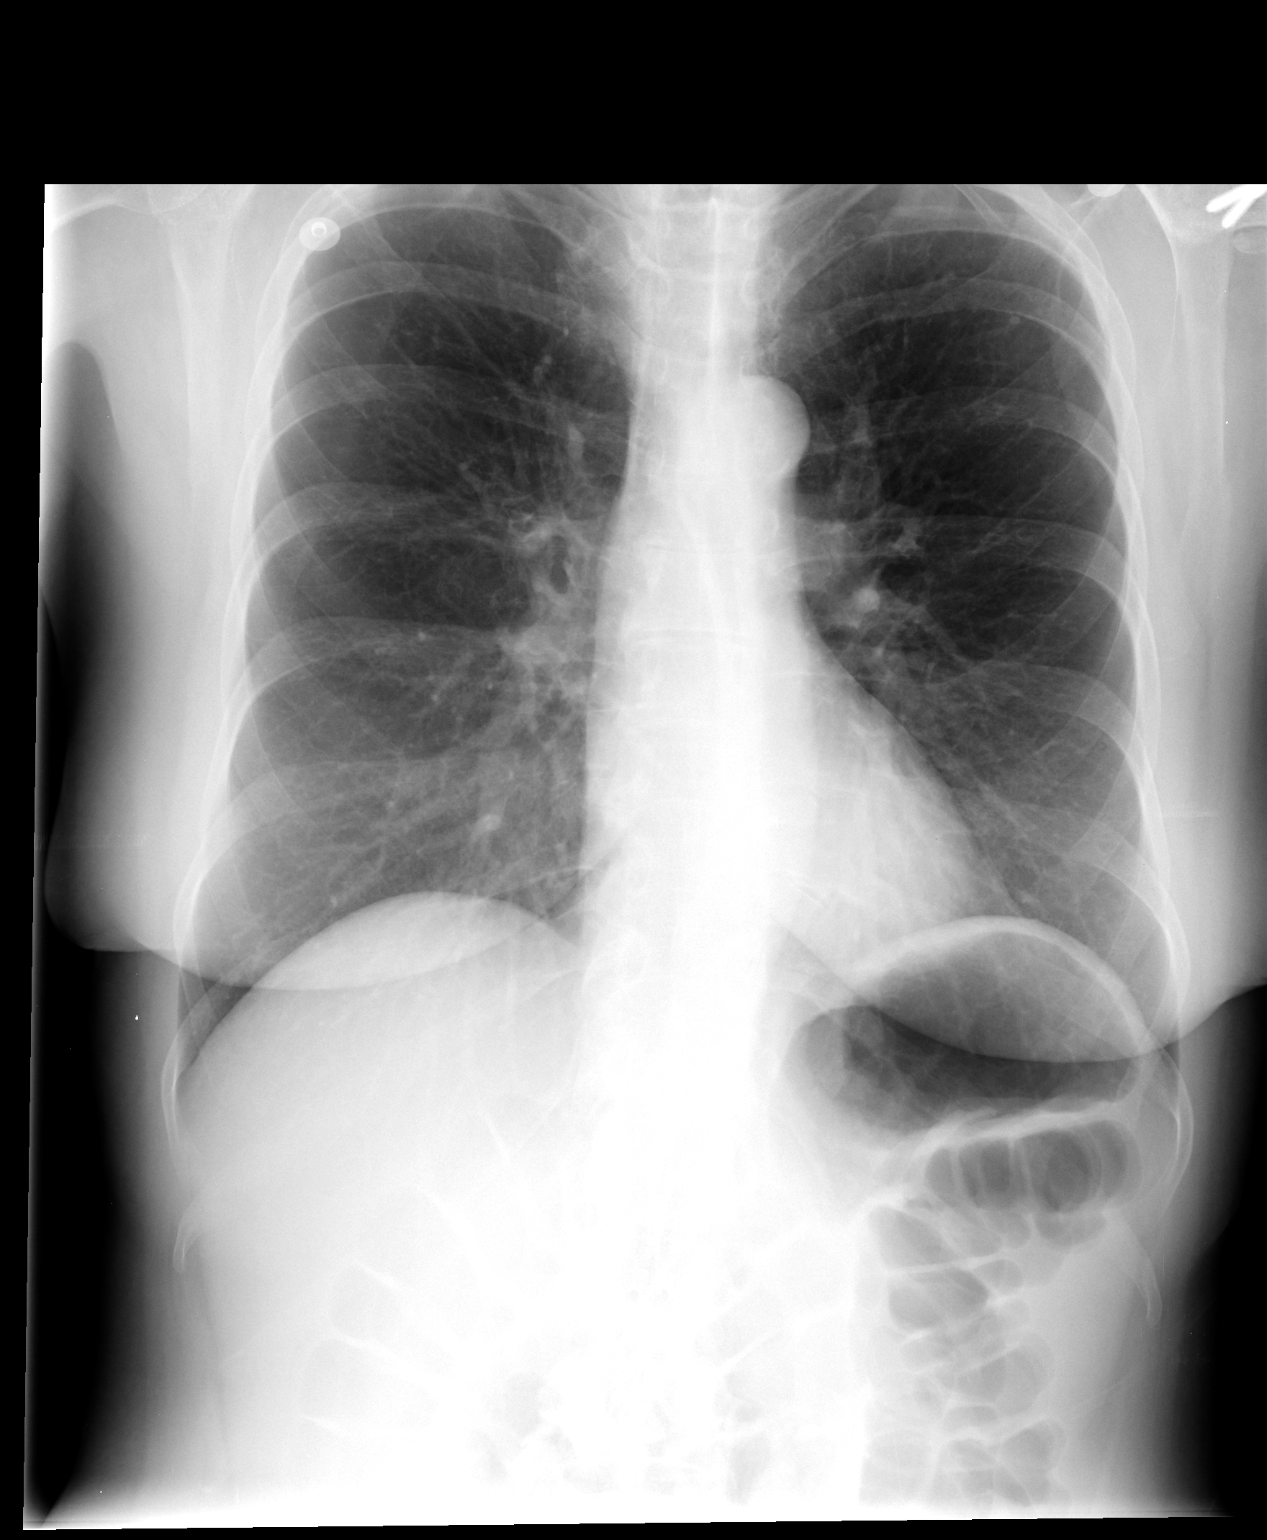

[view not recorded (2 of 2)]
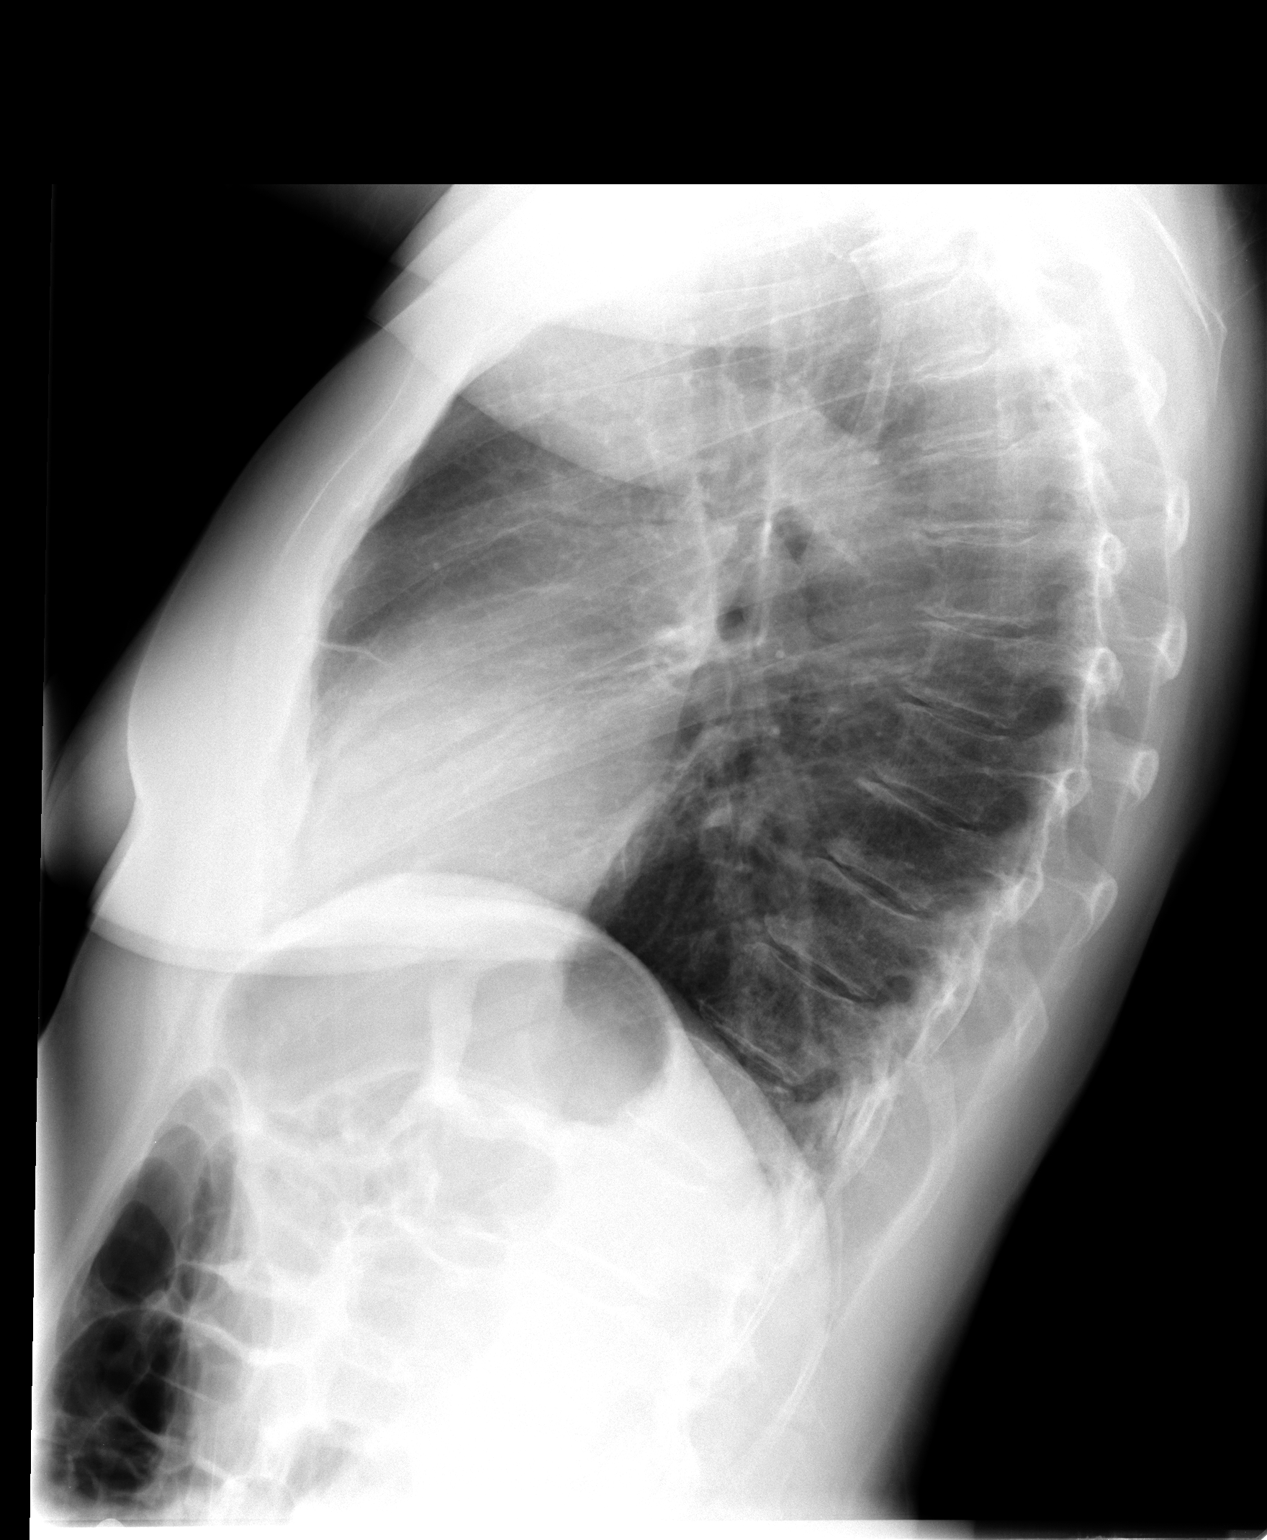

[2 of 2 positions shown; findings below may reference images not displayed]

FINDINGS: Chronic interstitial markings/emphysematous changes. No focal
consolidation. No pleural effusion or pneumothorax.

Heart is normal in size.

Degenerative changes of the visualized thoracolumbar spine. Status
post ORIF of the left proximal humerus.
IMPRESSION: No evidence of acute cardiopulmonary disease.
